# Patient Record
Sex: Female | Born: 1965
Health system: Southern US, Community
[De-identification: ages and names within clinical notes are randomized; demographics above are authoritative.]

## PROBLEM LIST (undated history)

## (undated) DIAGNOSIS — E785 Hyperlipidemia, unspecified: Secondary | ICD-10-CM

## (undated) DIAGNOSIS — I10 Essential (primary) hypertension: Secondary | ICD-10-CM

## (undated) DIAGNOSIS — F419 Anxiety disorder, unspecified: Secondary | ICD-10-CM

## (undated) HISTORY — DX: Hyperlipidemia, unspecified: E78.5

## (undated) HISTORY — DX: Anxiety disorder, unspecified: F41.9

---

## 1999-05-10 ENCOUNTER — Other Ambulatory Visit: Admission: RE | Admit: 1999-05-10 | Discharge: 1999-05-10 | Payer: Self-pay | Admitting: Obstetrics & Gynecology

## 2000-05-15 ENCOUNTER — Other Ambulatory Visit: Admission: RE | Admit: 2000-05-15 | Discharge: 2000-05-15 | Payer: Self-pay | Admitting: Obstetrics & Gynecology

## 2001-03-31 ENCOUNTER — Other Ambulatory Visit: Admission: RE | Admit: 2001-03-31 | Discharge: 2001-03-31 | Payer: Self-pay | Admitting: Obstetrics & Gynecology

## 2002-06-23 ENCOUNTER — Other Ambulatory Visit: Admission: RE | Admit: 2002-06-23 | Discharge: 2002-06-23 | Payer: Self-pay | Admitting: Obstetrics & Gynecology

## 2003-12-24 ENCOUNTER — Other Ambulatory Visit: Admission: RE | Admit: 2003-12-24 | Discharge: 2003-12-24 | Payer: Self-pay | Admitting: Obstetrics & Gynecology

## 2005-04-13 ENCOUNTER — Other Ambulatory Visit: Admission: RE | Admit: 2005-04-13 | Discharge: 2005-04-13 | Payer: Self-pay | Admitting: Obstetrics & Gynecology

## 2007-05-26 ENCOUNTER — Ambulatory Visit (HOSPITAL_COMMUNITY): Admission: RE | Admit: 2007-05-26 | Discharge: 2007-05-26 | Payer: Self-pay | Admitting: Specialist

## 2008-05-12 ENCOUNTER — Inpatient Hospital Stay (HOSPITAL_COMMUNITY): Admission: AD | Admit: 2008-05-12 | Discharge: 2008-05-15 | Payer: Self-pay | Admitting: Obstetrics & Gynecology

## 2008-05-13 ENCOUNTER — Encounter (INDEPENDENT_AMBULATORY_CARE_PROVIDER_SITE_OTHER): Payer: Self-pay | Admitting: Obstetrics & Gynecology

## 2010-02-07 ENCOUNTER — Encounter: Admission: RE | Admit: 2010-02-07 | Discharge: 2010-02-07 | Payer: Self-pay | Admitting: Obstetrics & Gynecology

## 2010-12-24 ENCOUNTER — Encounter: Payer: Self-pay | Admitting: Obstetrics & Gynecology

## 2011-02-06 ENCOUNTER — Other Ambulatory Visit: Payer: Self-pay | Admitting: Obstetrics & Gynecology

## 2011-04-17 NOTE — Op Note (Signed)
Alexandria Clark, Alexandria Clark              ACCOUNT NO.:  [id number]   MEDICAL RECORD NO.:  [id number]          PATIENT TYPE:  INP   LOCATION:  9166                          FACILITY:  [facility]   PHYSICIAN:  Gerrit Friends. Aldona Bar, M.D.   DATE OF BIRTH:  03/15/1966   DATE OF PROCEDURE:  05/13/2008  DATE OF DISCHARGE:                               OPERATIVE REPORT   PREOPERATIVE DIAGNOSES:  Term pregnancy, failure to descend, large for  gestational age meconium-stained amniotic fluid.   POSTOPERATIVE DIAGNOSES:  Term pregnancy, failure to descend, large for  gestational age meconium-stained amniotic fluid, delivery of 8-pound 12  ounces female infant, Apgars 5 and 9.   PROCEDURE:  Primary low transverse cesarean section.   SURGEON:  Gerrit Friends. Aldona Bar, MD   ANESTHESIA:  Epidural.   HISTORY:  This is a 45 year old gravida 2, para 0, was admitted to the  hospital on the evening of May 12, 2008, for induction, but was already  contracting.  She was seen earlier in the office for elevated blood  pressure.  Labs were all normal and the plan was to induce the patient  because of elevated blood pressure.  As mentioned; however, she was  contracting at the time of admission and was observed and did progress  from 1 cm at the time of initial evaluation by me at about 5:00 p.m. to  3 cm at 10:00 p.m. on May 12, 2008, although the vertex remained at -3  station.  Membranes were intact.  She labored, requested and received an  epidural and at about 4:00 a.m. was noted to be fully dilated; however,  the vertex was still at best -3 and membranes were bulging.  I came in  and ruptured membranes and particulate meconium-stained fluid was noted  and with pushing descent really did not come down below -3 station.  Because of an arrest of descent and suspected LGA infant, now with  meconium-stained fluid, a decision was made to proceed to primary low  transverse cesarean section for delivery.   OPERATIVE PROCEDURE:  The  patient was taken to the operating room where  her epidural was augmented.  Her Foley catheter was already in place.  She was prepped and draped in the usual fashion after being positioned  appropriately and once she was draped and good anesthetic levels were  documented, procedure was begun.   A Pfannenstiel incision was made with minimal difficulty, dissected down  sharply to and through the fascia in low transverse fashion with  hemostasis created at each layer.  Subfascial space was created  inferiorly and superiorly, muscles separated in midline, peritoneum  identified with care taken to avoid the bowel superiorly and bladder  inferiorly.  At this time, the vesicouterine peritoneum was incised in  low transverse fashion and pushed off the lower uterine segment with  ease.  Sharp incision in the lower segment was then carried out in a  transverse fashion with Metzenbaum scissors and extended laterally with  the fingers.  Thereafter an attempt was made to deliver, but the vacuum  extractor was required to facilitate delivery.  A viable female infant  was delivered from the vertex position and after the cord was clamped  and cut, the infant was passed off the waiting team where the baby was  rendered free of meconium which was mostly in the nose and mouth.  Infant's Apgar's were noted to be 5 and 9 and subsequently the infant  was taken to nursery in good condition.   The placenta was delivered intact and cord bloods will be obtained by  the cord blood donation team.  Placenta was subsequently sent to  pathology appropriately labeled.   The uterus was then exteriorized and good uterine contractility was  afforded with slowly given intravenous Pitocin and manual stimulation.  The cavity was rendered free of any remaining products of conception.  At this time, the uterine incision was closed using a single layer of #1  Vicryl in a running locking fashion.  Tubes and ovaries were noted  to be  normal, although there were some filmy adhesions involving the fallopian  tubes.  The uterus was replaced in the abdominal cavity after closure of  the uterine incision.  Uterine incision was noted to be dry and after  the abdomen was lavaged of all free blood and clot and no foreign bodies  were noted to be remaining in the abdominal cavity, closure of the  abdomen was carried out in layers.  The abdominal and peritoneum was  closed with 0 Vicryl in a running fashion and muscle secured with the  same.  Subfascial space was inspected and noted to be dry and closure of  the fascia was then carried out using 0 Vicryl from angle to midline  bilaterally.  Subcutaneous tissue was rendered hemostatic and thereafter  staples were used to close the skin.  A sterile pressure dressing was  applied and at this point, the patient was taken to recovery room in  satisfactory condition having tolerated the procedure well.  At the  conclusion of procedure, both mother and baby were doing well in their  respective recovery areas.  Estimated blood loss 500 mL.  All counts  were correct x2.  In summary, this patient presented as an induction at  term because of elevated blood pressure but was an early labor.  No  medications were require to facilitate labor and she requested full  dilatation although the vertex did not descend beyond -3 station.  Meconium-stained fluid was noted with membranes were ruptured after the  patient became fully dilated and because of an arrest disorder of  descent, the patient was taken to the operating for delivery by primary  low transverse cesarean section.      Gerrit Friends. Aldona Bar, M.D.  Electronically Signed     RMW/MEDQ  D:  05/13/2008  T:  05/13/2008  Job:  [id number]

## 2011-04-20 NOTE — Discharge Summary (Signed)
Alexandria Clark, Alexandria Clark              ACCOUNT NO.:  [id number]   MEDICAL RECORD NO.:  [id number]          PATIENT TYPE:  INP   LOCATION:  9135                          FACILITY:  [facility]   PHYSICIAN:  Malva Limes, M.D.    DATE OF BIRTH:  02/02/1966   DATE OF ADMISSION:  05/12/2008  DATE OF DISCHARGE:  05/15/2008                               DISCHARGE SUMMARY   FINAL DIAGNOSES:  Intrauterine pregnancy at term, suspected large for  gestational age infant, failure to descend, and meconium-stained  amniotic fluid.   PROCEDURE:  Primary low-transverse cesarean section.   SURGEON:  Gerrit Friends. Aldona Bar, MD   COMPLICATIONS:  None.   HOSPITAL COURSE:  This 45 year old G2, P 0-0-1-0 presents for an  induction on the evening of June 10.  The patient also had already been  contracting at this time.  She is at term.  She has had some elevated  blood pressures in the office that day.  All her lab work was normal,  but she was still planned for this induction secondary to these elevated  blood pressures at term.  The patient's antepartum course up to this  point had been complicated by advanced maternal age.  She did have an  amniocentesis with a normal female karyotype.  The rest of the patient's  antepartum course had been mostly uncomplicated.  She did pass a 3-hour  sugar test and had a negative group B strep culture obtained in our  office at 35 weeks.  On June 11, when the patient was checked by Dr.  Aldona Bar, the patient was already about 3 cm of dilation at a -3 station.  Upon rupture of membranes, meconium-stained amniotic fluid was noted and  also with pushing there was no descent of the vertex below -3 station.  Because of this arrest of descent and a suspected large for gestational  age infant, a decision was made to proceed with a cesarean section.  The  patient was taken to the operating room on May 13, 2008 by Dr. Annamaria Helling where a primary low-transverse cesarean section was performed  with  the delivery of an 8 pounds 12 ounces female infant with Apgars of 5 and  9.  This delivery went without complications.  The patient's  postoperative course was benign without any significant fevers.  The  patient was felt ready for discharge on postoperative day #2.  She was  sent home on a regular diet, told to decrease activities, told to  continue her prenatal vitamins, was given Percocet one to two every 4-6  hours as needed for pain, and was to follow up in our office in 4 weeks.  Instructions and precautions were reviewed with the patient.   DISCHARGE LABORATORY DATA:  The patient had a hemoglobin of 10.4, white  blood cell count of 9.3, and platelets of 128,000.      Leilani Able, P.A.-C.    ______________________________  Malva Limes, M.D.   MB/MEDQ  D:  05/31/2008  T:  06/01/2008  Job:  [id number]

## 2011-08-30 LAB — CBC
HCT: 37.7
Hemoglobin: 10.4 — ABNORMAL LOW
Hemoglobin: 12.6
MCV: 74.7 — ABNORMAL LOW
MCV: 75.7 — ABNORMAL LOW
Platelets: 161
RBC: 4.07
WBC: 6.7

## 2011-08-30 LAB — COMPREHENSIVE METABOLIC PANEL
ALT: 11
AST: 16
Albumin: 2.9 — ABNORMAL LOW
Alkaline Phosphatase: 146 — ABNORMAL HIGH
BUN: 5 — ABNORMAL LOW
Chloride: 104
GFR calc Af Amer: >60
Glucose, Bld: 103 — ABNORMAL HIGH
Potassium: 3.4 — ABNORMAL LOW
Sodium: 133 — ABNORMAL LOW

## 2011-08-30 LAB — URIC ACID: Uric Acid, Serum: 3.5

## 2011-08-30 LAB — CCBB MATERNAL DONOR DRAW

## 2012-01-07 ENCOUNTER — Encounter (HOSPITAL_COMMUNITY): Payer: Self-pay

## 2012-01-07 ENCOUNTER — Emergency Department (INDEPENDENT_AMBULATORY_CARE_PROVIDER_SITE_OTHER)
Admission: EM | Admit: 2012-01-07 | Discharge: 2012-01-07 | Disposition: A | Discharge status: Home / Self Care | Payer: 59 | Source: Home / Self Care | Attending: Family Medicine | Admitting: Family Medicine

## 2012-01-07 ENCOUNTER — Other Ambulatory Visit: Payer: Self-pay

## 2012-01-07 DIAGNOSIS — R002 Palpitations: Secondary | ICD-10-CM

## 2012-01-07 HISTORY — DX: Essential (primary) hypertension: I10

## 2012-01-07 NOTE — ED Provider Notes (Signed)
History     CSN: [id number]  Arrival date & time 01/07/12  0828   First MD Initiated Contact with Patient 01/07/12 [phone]      Chief Complaint  Patient presents with  . Tachycardia    (Consider location/radiation/quality/duration/timing/severity/associated sxs/prior treatment) HPI Comments: Alexandria Clark presents for evaluation of intermittent episodes of feeling like her heart is racing. She states that she has checked her pulse and found it to be as high as 120s and 140s at times. She reports increased stress recently. She also reports changes in her BP medications, recently starting lisinopril, and wonders if it is related to that. She has been on enalapril and metoprolol in the past. She has never seen a cardiologist.   Patient is a 46 y.o. female presenting with palpitations. The history is provided by the patient.  Palpitations  This is a recurrent problem. The current episode started more than 1 week ago. The problem has not changed since onset.The problem is associated with an unknown factor. Associated symptoms include irregular heartbeat. Pertinent negatives include no diaphoresis, no numbness, no chest pain, no chest pressure, no exertional chest pressure, no near-syncope, no syncope, no dizziness and no weakness. Risk factors include no known risk factors.    Past Medical History  Diagnosis Date  . Hypertension     Past Surgical History  Procedure Date  . Cesarean section     No family history on file.  History  Substance Use Topics  . Smoking status: Never Smoker   . Smokeless tobacco: Not on file  . Alcohol Use: No    OB History    Grav Para Term Preterm Abortions TAB SAB Ect Mult Living                  Review of Systems  Constitutional: Negative.  Negative for diaphoresis.  HENT: Negative.   Eyes: Negative.   Respiratory: Negative.   Cardiovascular: Positive for palpitations. Negative for chest pain, syncope and near-syncope.  Gastrointestinal: Negative.     Genitourinary: Negative.   Musculoskeletal: Negative.   Skin: Negative.   Neurological: Negative.  Negative for dizziness, weakness and numbness.    Allergies  Enalapril and Metoprolol  Home Medications   Current Outpatient Rx  Name Route Sig Dispense Refill  . LISINOPRIL 10 MG PO TABS Oral Take 10 mg by mouth daily.      BP 130/65  Pulse 80  Temp(Src) 98.1 F (36.7 C) (Oral)  Resp 16  SpO2 100%  LMP 12/08/2011  Physical Exam  Nursing note and vitals reviewed. Constitutional: She is oriented to person, place, and time. She appears well-developed and well-nourished.  HENT:  Head: Normocephalic and atraumatic.  Eyes: EOM are normal.  Neck: Normal range of motion.  Cardiovascular: Normal rate and regular rhythm.   No murmur heard. Pulmonary/Chest: Effort normal and breath sounds normal.  Musculoskeletal: Normal range of motion.  Neurological: She is alert and oriented to person, place, and time.  Skin: Skin is warm and dry.  Psychiatric: Her behavior is normal.    ED Course  Procedures (including critical care time)  Labs Reviewed - No data to display No results found.   1. Heart palpitations       MDM  ECG showed NSR, rate 68; rhythm strip for 1 minute showed sinus rhythm but heart rate increased from 80 bpm to 95 bpm over 1 minute; referred back to PCP for cardiology referral and work-up        Richardo Priest, MD  01/07/12 1015 

## 2012-01-07 NOTE — ED Notes (Signed)
C/o episodes of tachycardia since December.  States she thinks her lisinopril is causing it.  States she started on lisinopril in Sept 2012 after undesirable side effects from trials with enalapril and metoprolol.  Reports the episodes last only a few moments and she denies other sx other than feeling anxious- reports sx resolve with relaxation and deep breathing.  Last episode was last night and prior to that a few days before.  Has not contacted her PCP because she wants a different PCP due to all the side effects she is having with the medications they prescribe.  Denies hx of anxiety/panic disorder but states she is under a lot of stress right now.

## 2012-01-08 ENCOUNTER — Emergency Department (HOSPITAL_COMMUNITY): Payer: 59

## 2012-01-08 ENCOUNTER — Emergency Department (HOSPITAL_COMMUNITY)
Admission: EM | Admit: 2012-01-08 | Discharge: 2012-01-08 | Disposition: A | Discharge status: Home / Self Care | Payer: 59 | Attending: Emergency Medicine | Admitting: Emergency Medicine

## 2012-01-08 ENCOUNTER — Other Ambulatory Visit: Payer: Self-pay

## 2012-01-08 ENCOUNTER — Encounter (HOSPITAL_COMMUNITY): Payer: Self-pay | Admitting: *Deleted

## 2012-01-08 DIAGNOSIS — R002 Palpitations: Secondary | ICD-10-CM

## 2012-01-08 DIAGNOSIS — Z79899 Other long term (current) drug therapy: Secondary | ICD-10-CM | POA: Insufficient documentation

## 2012-01-08 DIAGNOSIS — I10 Essential (primary) hypertension: Secondary | ICD-10-CM | POA: Insufficient documentation

## 2012-01-08 LAB — POCT I-STAT, CHEM 8
HCT: 38 % (ref 36.0–46.0)
Potassium: 3.4 mEq/L — ABNORMAL LOW (ref 3.5–5.1)

## 2012-01-08 LAB — CBC
MCH: 22.9 pg — ABNORMAL LOW (ref 26.0–34.0)
MCV: 70.1 fL — ABNORMAL LOW (ref 78.0–100.0)
RDW: 14.5 % (ref 11.5–15.5)

## 2012-01-08 LAB — POCT I-STAT TROPONIN I: Troponin i, poc: 0 ng/mL (ref 0.00–0.08)

## 2012-01-08 MED ORDER — ALPRAZOLAM 0.25 MG PO TABS: 0.2500 mg | ORAL_TABLET | Freq: Three times a day (TID) | ORAL | Status: DC | PRN

## 2012-01-08 NOTE — ED Provider Notes (Signed)
History     CSN: [id number]  Arrival date & time 01/08/12  0502   First MD Initiated Contact with Patient 01/08/12 [phone]      Chief Complaint  Patient presents with  . Tachycardia    (Consider location/radiation/quality/duration/timing/severity/associated sxs/prior treatment) The history is provided by the patient.   patient's been having episodes of tachycardia. She states she's had a few episodes where at night she feels her heart racing. Stopped 120 or 30 by her checking. She seen at urgent care yesterday and was told to follow with her physician. No lightheadedness or dizziness with it. She states she feels anxious. No chest pain. No cough or trouble breathing. No diaphoresis. No weight loss. No heavy periods.   Past Medical History  Diagnosis Date  . Hypertension     Past Surgical History  Procedure Date  . Cesarean section     No family history on file.  History  Substance Use Topics  . Smoking status: Never Smoker   . Smokeless tobacco: Not on file  . Alcohol Use: No    OB History    Grav Para Term Preterm Abortions TAB SAB Ect Mult Living                  Review of Systems  Constitutional: Negative for activity change and appetite change.  HENT: Negative for neck stiffness.   Eyes: Negative for pain.  Respiratory: Negative for chest tightness and shortness of breath.   Cardiovascular: Positive for palpitations. Negative for chest pain and leg swelling.  Gastrointestinal: Negative for nausea, vomiting, abdominal pain and diarrhea.  Genitourinary: Negative for flank pain.  Musculoskeletal: Negative for back pain.  Skin: Negative for rash.  Neurological: Negative for weakness, numbness and headaches.  Psychiatric/Behavioral: Negative for behavioral problems.    Allergies  Enalapril and Metoprolol  Home Medications   Current Outpatient Rx  Name Route Sig Dispense Refill  . LISINOPRIL 10 MG PO TABS Oral Take 10 mg by mouth daily.    Marland Kitchen ALPRAZOLAM 0.25  MG PO TABS Oral Take 1 tablet (0.25 mg total) by mouth 3 (three) times daily as needed for anxiety. 15 tablet 0    BP 136/80  Pulse 83  Temp(Src) 98.5 F (36.9 C) (Oral)  Resp 22  SpO2 100%  LMP 12/23/2011  Physical Exam  Nursing note and vitals reviewed. Constitutional: She is oriented to person, place, and time. She appears well-developed and well-nourished.  HENT:  Head: Normocephalic and atraumatic.  Eyes: Pupils are equal, round, and reactive to light.  Neck: Normal range of motion. Neck supple. No thyromegaly present.  Cardiovascular: Normal rate, regular rhythm and normal heart sounds.   No murmur heard. Pulmonary/Chest: Effort normal and breath sounds normal. No respiratory distress. She has no wheezes. She has no rales.  Abdominal: Soft. Bowel sounds are normal. She exhibits no distension. There is no tenderness. There is no rebound and no guarding.  Musculoskeletal: Normal range of motion.  Neurological: She is alert and oriented to person, place, and time. No cranial nerve deficit.  Skin: Skin is warm and dry.  Psychiatric: She has a normal mood and affect. Her speech is normal.    ED Course  Procedures (including critical care time)  Labs Reviewed  CBC - Abnormal; Notable for the following:    Hemoglobin 11.5 (*)    HCT 35.2 (*)    MCV 70.1 (*)    MCH 22.9 (*)    All other components within normal limits  POCT I-STAT, CHEM 8 - Abnormal; Notable for the following:    Potassium 3.4 (*)    BUN 5 (*)    Glucose, Bld 122 (*)    All other components within normal limits  POCT I-STAT TROPONIN I   Dg Chest 2 View  01/08/2012  *RADIOLOGY REPORT*  Clinical Data: Palpitations, hypertension.  CHEST - 2 VIEW  Comparison: None.  Findings: Lungs are clear. No pleural effusion or pneumothorax. The cardiomediastinal contours are within normal limits. The visualized bones and soft tissues are without significant appreciable abnormality.  IMPRESSION: No acute cardiopulmonary  process.  Original Report Authenticated By: Waneta Martins, M.D.     1. Palpitations      Date: 01/08/2012  Rate: 90  Rhythm: normal sinus rhythm  QRS Axis: normal  Intervals: normal  ST/T Wave abnormalities: normal  Conduction Disutrbances:none  Narrative Interpretation:   Old EKG Reviewed: unchanged    MDM  Palpitations anxiety. EKG is normal here. Lab work is reassuring except for minimal hypokalemia. She is instructed on a diet for that. She will followup with her primary care and cardiology for further evaluation.        Juliet Rude. Rubin Payor, MD 01/08/12 [phone]

## 2012-01-08 NOTE — ED Notes (Signed)
Pt. Discharged to home, pt. Alert and oriented, NAD noted, respirations even and regular 

## 2012-01-08 NOTE — ED Notes (Signed)
Pt has had issues with blood pressure medications in past with dizziness.  Pt sts she has been on Lisinopril since August and was fine until December and had one episode of heart racing.  Then lately had 3 episodes and thought it was related to anxiety.  Pt was seen at [facility] yesterday and told to follow up with primary care.    No chest pain.  Pt sts she feels funny, shakey, jittery.

## 2012-01-10 ENCOUNTER — Ambulatory Visit: Payer: 59 | Admitting: Family Medicine

## 2012-01-10 VITALS — BP 137/87 | HR 75 | Temp 98.0°F | Resp 18 | Ht 68.0 in | Wt 205.0 lb

## 2012-01-10 DIAGNOSIS — N926 Irregular menstruation, unspecified: Secondary | ICD-10-CM

## 2012-01-10 DIAGNOSIS — N915 Oligomenorrhea, unspecified: Secondary | ICD-10-CM

## 2012-01-10 DIAGNOSIS — R002 Palpitations: Secondary | ICD-10-CM

## 2012-01-10 LAB — TSH: TSH: 1.114 u[IU]/mL (ref 0.350–4.500)

## 2012-01-10 LAB — COMPREHENSIVE METABOLIC PANEL
CO2: 27 mEq/L (ref 19–32)
Chloride: 103 mEq/L (ref 96–112)
Creat: 0.8 mg/dL (ref 0.50–1.10)
Glucose, Bld: 96 mg/dL (ref 70–99)
Potassium: 3.9 mEq/L (ref 3.5–5.3)
Sodium: 138 mEq/L (ref 135–145)

## 2012-01-10 LAB — POCT URINALYSIS DIPSTICK
Bilirubin, UA: NEGATIVE
Glucose, UA: NEGATIVE
Ketones, UA: NEGATIVE
Leukocytes, UA: NEGATIVE
Protein, UA: NEGATIVE
pH, UA: 6

## 2012-01-10 MED ORDER — LISINOPRIL 10 MG PO TABS: 10.0000 mg | ORAL_TABLET | Freq: Every day | ORAL | Status: DC

## 2012-01-10 MED ORDER — ALPRAZOLAM 0.25 MG PO TABS: 0.2500 mg | ORAL_TABLET | Freq: Three times a day (TID) | ORAL | Status: DC | PRN

## 2012-01-10 NOTE — Patient Instructions (Signed)
We will call you with your cardiology appt

## 2012-01-10 NOTE — Progress Notes (Signed)
Patient Name: Alexandria Clark Date of Birth: 07-19-1966 Medical Record Number: [id number] Gender: female Date of Encounter: 01/10/2012  History of Present Illness:  Alexandria Clark is a 46 y.o. very pleasant female patient who presents with the following:  History of hypertension. Has noted increasingly frequent periods of tachycardia over the last few months. These episodes had occurred quite sporadically, but have become more frequent over the last few weeks. Occur about once a day, last maybe 15 minutes.  Seem to get better if she "can destress."  Seen at [facility] and at ED over the last week- diagnosed with anxiety? vs other problem.  Suggested referral to cardiology. Also given Rx for xanax which she has tried.  Did help her with sleep.  Took one with an episode of tachycardia and ? If it helped it to go away faster.  Notes that the racing heart occurs first- then notes feeling of anxiety, sweating.   No chest pain or SOB. She is also interested in "taking some time off of work to [employer]".  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  Interested in [employer]. Has had to stop working some days- this has affected 3 days over the last 10 days.  We discussed FMLA and the possibility that a correctable cause will be found- we agreed to take her out of work for one week once I receive her FMLA papers LMP 12/24/11- now has another period as well!  Per pt no chance of pregnancy.  Her menstrual cycles have been shortened over the last several months.    There is no problem list on file for this patient.  Past Medical History  Diagnosis Date  . Hypertension    Past Surgical History  Procedure Date  . Cesarean section    History  Substance Use Topics  . Smoking status: Never Smoker   . Smokeless tobacco: Not on file  . Alcohol Use: No   No family history on file. Allergies  Allergen Reactions  . Enalapril Other (See Comments)    Dizziness  . Metoprolol Other (See  Comments)    Dizziness and hair loss    Medication list has been reviewed and updated. I have reviewed the patient's medical history in detail and updated the computerized patient record. Results for orders placed in visit on 01/10/12  POCT URINALYSIS DIPSTICK      Component Value Range   Color, UA yellow     Clarity, UA clear     Glucose, UA negative     Bilirubin, UA negative     Ketones, UA negative     Spec Grav, UA 1.025     Blood, UA large     pH, UA 6.0     Protein, UA negative     Urobilinogen, UA 0.2     Nitrite, UA negative     Leukocytes, UA Negative    POCT URINE PREGNANCY      Component Value Range   Preg Test, Ur Negative    pt has menses currently. 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  In December her mother had a health scare- dx with MGUS.  This health scare may have worsened her symptoms.  Has a hard time relaxing.  Notes trouble falling asleep due to "a lot on her mind".  Does not really feel sad.  Appetite ok.  Her mother and 3 year  old daughter live with her.   Physical Examination: Filed Vitals:   01/10/12 1144  BP: 137/87  Pulse: 75  Temp: 98 F (36.7 C)  TempSrc: Oral  Resp: 18  Height: 5\' 8"  (1.727 m)  Weight: 205 lb (92.987 kg)    Body mass index is 31.17 kg/(m^2).  GEN: WDWN, NAD, Non-toxic, A & O x 3, overweight HEENT: Atraumatic, Normocephalic. Neck supple. No masses, No LAD.  Thyroid feels normal Ears and Nose: No external deformity. TM, oropharynx wnl bilaterally CV: RRR, No M/G/R. No JVD. No thrill. No extra heart sounds. PULM: CTA B, no wheezes, crackles, rhonchi. No retractions. No resp. distress. No accessory muscle use. ABD: S, NT, ND. EXTR: No c/c/e NEURO Normal gait.  PSYCH: Normally interactive. Conversant. Not depressed or anxious appearing.  Calm demeanor.    Assessment and Plan: 1. Palpitations  TSH, Comprehensive metabolic panel, Ambulatory referral  to Cardiology, ALPRAZolam (XANAX) 0.25 MG tablet, lisinopril (PRINIVIL,ZESTRIL) 10 MG tablet  2. Irregular menses  POCT urinalysis dipstick, POCT urine pregnancy   46 year old female with recent onset of heart palpitations/ racing heart beat which she feels may be due to anxiety or panic attacks.  However, she has not suffered from these problems before and is not depressed.  Need to consider reversible physical cause of her symptoms as well.  Will refer to cardiology and follow-up on labs.  Gave RF of xanax to use PRN and also her lisinopril.  discussed possibly trying another beta blocker in the future, or perhaps an SSRI.  She will seek care if her symptoms worsen or change in the meantime.   Irregular menses could be due to peri-menopause, stress or thyroid problems.

## 2012-01-11 ENCOUNTER — Encounter: Payer: Self-pay | Admitting: Family Medicine

## 2012-01-21 ENCOUNTER — Encounter: Payer: Self-pay | Admitting: *Deleted

## 2012-01-21 DIAGNOSIS — E785 Hyperlipidemia, unspecified: Secondary | ICD-10-CM

## 2012-01-21 DIAGNOSIS — Z0271 Encounter for disability determination: Secondary | ICD-10-CM

## 2012-01-21 DIAGNOSIS — G43909 Migraine, unspecified, not intractable, without status migrainosus: Secondary | ICD-10-CM | POA: Insufficient documentation

## 2012-01-21 DIAGNOSIS — I10 Essential (primary) hypertension: Secondary | ICD-10-CM | POA: Insufficient documentation

## 2012-01-23 ENCOUNTER — Ambulatory Visit (INDEPENDENT_AMBULATORY_CARE_PROVIDER_SITE_OTHER): Payer: 59 | Admitting: Family Medicine

## 2012-01-23 ENCOUNTER — Encounter: Payer: Self-pay | Admitting: Family Medicine

## 2012-01-23 DIAGNOSIS — E782 Mixed hyperlipidemia: Secondary | ICD-10-CM

## 2012-01-23 DIAGNOSIS — F411 Generalized anxiety disorder: Secondary | ICD-10-CM

## 2012-01-23 DIAGNOSIS — I10 Essential (primary) hypertension: Secondary | ICD-10-CM

## 2012-01-23 DIAGNOSIS — E785 Hyperlipidemia, unspecified: Secondary | ICD-10-CM

## 2012-01-23 DIAGNOSIS — F1520 Other stimulant dependence, uncomplicated: Secondary | ICD-10-CM

## 2012-01-23 DIAGNOSIS — F419 Anxiety disorder, unspecified: Secondary | ICD-10-CM

## 2012-01-23 LAB — LIPID PANEL
LDL Cholesterol: 176 mg/dL — ABNORMAL HIGH (ref 0–99)
Total CHOL/HDL Ratio: 4.5 Ratio
VLDL: 13 mg/dL (ref 0–40)

## 2012-01-23 LAB — VITAMIN D 25 HYDROXY (VIT D DEFICIENCY, FRACTURES): Vit D, 25-Hydroxy: 14 ng/mL — ABNORMAL LOW (ref 30–89)

## 2012-01-23 NOTE — Patient Instructions (Signed)
Palpitations  A palpitation is the feeling that your heartbeat is irregular or is faster than normal. Although this is frightening, it usually is not serious. Palpitations may be caused by excesses of smoking, caffeine, or alcohol. They are also brought on by stress and anxiety. Sometimes, they are caused by heart disease. Unless otherwise noted, your caregiver did not find any signs of serious illness at this time. HOME CARE INSTRUCTIONS  To help prevent palpitations:  Drink decaffeinated coffee, tea, and soda pop. Avoid chocolate.   If you smoke or drink alcohol, quit or cut down as much as possible.   Reduce your stress or anxiety level. Biofeedback, yoga, or meditation will help you relax. Physical activity such as swimming, jogging, or walking also may be helpful.  SEEK MEDICAL CARE IF:   You continue to have a fast heartbeat.   Your palpitations occur more often.  SEEK IMMEDIATE MEDICAL CARE IF: You develop chest pain, shortness of breath, severe headache, dizziness, or fainting. Document Released: 11/16/2000 Document Revised: 08/01/2011 Document Reviewed: 01/16/2008 [facility] Patient Information 2012 [place], [place]   .Sources of Caffeine Coffee / Caffeine (mg)  Coffee, brewed, 8 oz / 100 to 200 mg   Coffee, instant, 8 oz / 27 to 173 mg   Espresso, 1 oz / 75 mg  Decaffeinated coffee has 97% of its caffeine removed. Tea / Caffeine (mg)  Tea, brewed, 8 oz / 40 to 120 mg  Caffeine content depends upon the type of tea. Other / Caffeine (mg)  Cola, 12 oz / 35 mg   Chocolate bar, 1.55 oz / 9 mg   Energy drinks, supplements, and pain medicine vary depending on brand. Check the label or with the manufacturer for exact amounts of caffeine.  Document Released: 11/16/2000 Document Revised: 08/01/2011 Document Reviewed: 02/16/2011 [facility] Patient Information 2012 [place], [place].

## 2012-01-24 DIAGNOSIS — F419 Anxiety disorder, unspecified: Secondary | ICD-10-CM | POA: Insufficient documentation

## 2012-01-24 DIAGNOSIS — F1520 Other stimulant dependence, uncomplicated: Secondary | ICD-10-CM | POA: Insufficient documentation

## 2012-01-24 NOTE — Progress Notes (Signed)
  Subjective:    Patient ID: Alexandria Clark, female    DOB: 1966-01-31, 46 y.o.   MRN: [id number]  HPI  This 46 y.o.AA female is here for CPE but has been seen 46 times this month at both [facility]  and [facility] for evaluation of irregular heart and palpitations as well as HTN. She is on medication and is scheduled   for Cardiology assessment . Upon further questioning, she admits that she is having significant anxiety  related to family issues; she has a 46 y.o. And is helping her mother with her health issues. Also, She consumes excessive amounts of caffeine- Mt. Dew is her drink of choice.When she has tried to abstain  from this beverage, she get a headache.       Her menstrual cycle is irregular; last PAP was March 2011 (negative).She has one living child and has had  one abortion.  The pt will schedule GYN exam with Dr. Arlyce Dice ( OB-GYN). Mammogram: April 2012- negative.   Review of Systems  Neurological: Positive for headaches.       Headache occurs 1-2 x monthly and thought to be stress-related  All other systems reviewed and are negative.       Objective:   Physical Exam  Vitals reviewed. Constitutional: She is oriented to person, place, and time. She appears well-developed and well-nourished. No distress.  HENT:  Head: Normocephalic and atraumatic.  Right Ear: External ear normal.  Left Ear: External ear normal.  Nose: Nose normal.  Mouth/Throat: Oropharynx is clear and moist.  Eyes: Conjunctivae and EOM are normal. No scleral icterus.  Neck: Normal range of motion. Neck supple. No thyromegaly present.  Cardiovascular: Normal rate, regular rhythm and normal heart sounds.  Exam reveals no gallop.   No murmur heard. Pulmonary/Chest: Effort normal and breath sounds normal. No respiratory distress.  Abdominal: Soft. Bowel sounds are normal. She exhibits no mass. There is no tenderness.  Musculoskeletal: Normal range of motion. She exhibits no edema and no tenderness.  Neurological:  She is alert and oriented to person, place, and time. She has normal reflexes. No cranial nerve deficit.  Skin: Skin is warm and dry.  Psychiatric: She has a normal mood and affect. Her behavior is normal. Thought content normal.    Labs reviewed from recent ED and [facility] visits      Assessment & Plan:   1. Hyperlipemia  Lipid panel ( significant family history for cardiac-associated medical issues)  2. HTN (hypertension)  Vitamin D, 25-hydroxy  3. Caffeine addiction  Strongly advised to gradually reduce Mt. Dew intake over next 2 weeks to no more than 1 8-oz can per day (would then work on  further reduction)  Try not to use caffeine as "Eye -opener" every day  4. Anxiety disorder  Discussed strategies for coping with stress to include Exercise and better sleep hygiene

## 2012-01-25 MED ORDER — ROSUVASTATIN CALCIUM 20 MG PO TABS: 20.0000 mg | ORAL_TABLET | Freq: Every day | ORAL | Status: DC

## 2012-01-25 NOTE — Progress Notes (Signed)
Labs reviewed and Total chol and LDL still very high even on Crestor 10 mg. I have increased Crestor to 20 mg and routed it to her pharmacy  Please notify her to increase  current Crestor 10 mg to 2 tablets daily until gone  and then pick up the new Rx (20 mg) and take 1 tab daily. There are RFs on new dose.  She will need to RTC in 8 weeks for fasting labs which will be ordered.  She is also Vitamin D deficient and needs to get an OTC supplement-  5000 IU capsules and take 1 capsule daily for 3 months then reduce dose to 2000 IU per capsule daily.

## 2012-01-25 NOTE — Progress Notes (Signed)
Addended by: Dow Adolph B on: 01/25/2012 08:10 PM   Modules accepted: Orders

## 2012-01-25 NOTE — Progress Notes (Signed)
Quick Note:  Please call pt and advise that the following labs are abnormal..Marland KitchenLipids are still high. I have increased Crestor to 20 mg daily but she should finish current supply of Crestor 10 mg by taking 2 daily. Pick up new Rx when finish with 10 mg tabs. Follow-up labs in 8 weeks have been ordered.  Vitamin D is very low; pt needs to get OTC Vit D 5000 IU caps and take one daily for 3 months then decrease dose to Vit D 2000 IU daily.  She should plan to RTC to see me in ~4 months.  Provide her with a copy of labs.  Thank you.  ______

## 2012-01-26 ENCOUNTER — Telehealth: Payer: Self-pay

## 2012-01-26 NOTE — Telephone Encounter (Signed)
Done

## 2012-01-29 ENCOUNTER — Ambulatory Visit (INDEPENDENT_AMBULATORY_CARE_PROVIDER_SITE_OTHER): Payer: 59 | Admitting: Cardiology

## 2012-01-29 ENCOUNTER — Encounter: Payer: Self-pay | Admitting: Cardiology

## 2012-01-29 VITALS — BP 120/88 | HR 78 | Wt 211.6 lb

## 2012-01-29 DIAGNOSIS — R002 Palpitations: Secondary | ICD-10-CM

## 2012-01-29 DIAGNOSIS — I10 Essential (primary) hypertension: Secondary | ICD-10-CM

## 2012-01-29 NOTE — Assessment & Plan Note (Signed)
We need to document her rhythm while she is having palpitations. It is possible that she could have a supraventricular tachycardia. Her cardiac exam and ECG are normal. I think her risk is relatively low. I have okayed her to return to work. We will have her wear an event monitor to try and document her arrhythmia. If her rhythm is normal during these episodes and she may just have anxiety or panic attacks. If she does have tachycardia than the first line of treatment with the elimination of caffeine. I have also encouraged her to get regular aerobic exercise.

## 2012-01-29 NOTE — Progress Notes (Signed)
Alexandria Clark Date of Birth: [birth year] Medical Record #[id number]  History of Present Illness: [name] is seen at the request of Dr. Patsy Lager for evaluation of palpitations. She is a pleasant 46 year old [place] American female who reports she began experiencing palpitations in December. These are described as a rapid heartbeat. Associated with sweating. She has no other associated symptoms. She denies dizziness, syncope, shortness of breath, or chest pain. Typically her symptoms last about 15 minutes and then resolved. Over the past month she has noticed increased frequency of her symptoms to the point where she went to the emergency room on 2 occasions. On both times her symptoms had resolved by the time she presented to the emergency room. Her evaluation was unremarkable. She does drink a lot of caffeine and states she has a [employer]. She does not smoke. She does have a history of hypertension and hyperlipidemia. She had been on metoprolol in the past for blood pressure but at 50 mg per day this led to significant dizziness.  Current Outpatient Prescriptions on File Prior to Visit  Medication Sig Dispense Refill  . ALPRAZolam (XANAX) 0.25 MG tablet Take 1 tablet (0.25 mg total) by mouth 3 (three) times daily as needed for anxiety.  30 tablet  0  . lisinopril (PRINIVIL,ZESTRIL) 10 MG tablet Take 1 tablet (10 mg total) by mouth daily.  30 tablet  3  . rosuvastatin (CRESTOR) 20 MG tablet Take 1 tablet (20 mg total) by mouth daily.  30 tablet  3    Allergies  Allergen Reactions  . Enalapril Other (See Comments)    Dizziness  . Metoprolol Other (See Comments)    Dizziness and hair loss    Past Medical History  Diagnosis Date  . Hypertension   . Other and unspecified hyperlipidemia   . Migraine     ocp induced    Past Surgical History  Procedure Date  . Cesarean section 05/2008    History  Smoking status  . Never Smoker   Smokeless tobacco  . Not on file     History  Alcohol Use No    Family History  Problem Relation Age of Onset  . Hypertension Mother   . Hyperlipidemia Mother   . Cancer Mother 80    Breast  . Hypertension Maternal Aunt   . Hyperlipidemia Maternal Aunt   . Cancer Maternal Aunt 55    Breast; Multiple Myeloma  . Hypertension Maternal Uncle   . Diabetes Maternal Uncle   . Drug abuse Maternal Uncle   . Heart disease Maternal Uncle   . Heart disease Maternal Grandmother     MI @ 59- survived  . Stroke Maternal Grandmother 73  . Diabetes Cousin     Review of Systems: The review of systems is positive for recent diagnosis of vitamin D deficiency. She has a history of mild anemia.  All other systems were reviewed and are negative.  Physical Exam: BP 120/88  Pulse 78  Wt 211 lb 9.6 oz (95.981 kg)  LMP 01/07/2012 She is a pleasant white female in no distress.The patient is alert and oriented x 3.  The mood and affect are normal.  The skin is warm and dry.  Color is normal.  The HEENT exam reveals that the sclera are nonicteric.  The mucous membranes are moist.  The carotids are 2+ without bruits.  There is no thyromegaly.  There is no JVD.  The lungs are clear.  The chest wall is non  tender.  The heart exam reveals a regular rate with a normal S1 and S2.  There are no murmurs, gallops, or rubs.  The PMI is not displaced.   Abdominal exam reveals good bowel sounds.  There is no guarding or rebound.  There is no hepatosplenomegaly or tenderness.  There are no masses.  Exam of the legs reveal no clubbing, cyanosis, or edema.  The legs are without rashes.  The distal pulses are intact.  Cranial nerves II - XII are intact.  Motor and sensory functions are intact.  The gait is normal.  LABORATORY DATA: ECG was reviewed from the emergency department. And was normal. Chemistry panel and TSH were normal. Hemoglobin was 11.4.  Assessment / Plan:

## 2012-01-29 NOTE — Patient Instructions (Signed)
We will have you wear a monitor.  I will see you again in 6 weeks.

## 2012-01-31 ENCOUNTER — Telehealth: Payer: Self-pay

## 2012-01-31 NOTE — Telephone Encounter (Signed)
.  [facility] PT STATES HER PHARMACY REQUESTED A REFILL ON HER ALPRAZOLAM AND IT WAS DENIED BECAUSE WE HAD GIVEN IT TO HER ON THE 7TH. HOWEVER SHE IS TO TAKE 3 TIMES A DAY AND HAVE RUN OUT. PLEASE CALL [phone]   [facility] [address]

## 2012-02-01 ENCOUNTER — Ambulatory Visit (INDEPENDENT_AMBULATORY_CARE_PROVIDER_SITE_OTHER): Payer: 59 | Admitting: Family Medicine

## 2012-02-01 VITALS — BP 126/79 | HR 72 | Temp 97.6°F | Resp 16 | Ht 68.0 in | Wt 203.6 lb

## 2012-02-01 DIAGNOSIS — R002 Palpitations: Secondary | ICD-10-CM

## 2012-02-01 DIAGNOSIS — F411 Generalized anxiety disorder: Secondary | ICD-10-CM

## 2012-02-01 DIAGNOSIS — F419 Anxiety disorder, unspecified: Secondary | ICD-10-CM

## 2012-02-01 MED ORDER — ALPRAZOLAM 0.25 MG PO TABS: 0.2500 mg | ORAL_TABLET | Freq: Three times a day (TID) | ORAL | Status: AC | PRN

## 2012-02-01 MED ORDER — PAROXETINE HCL 20 MG PO TABS: 20.0000 mg | ORAL_TABLET | ORAL | Status: DC

## 2012-02-01 NOTE — Telephone Encounter (Signed)
Dr Patsy Lager, do you want to RF? It does look like you wrote Rx for TID (prn) on 01/10/12

## 2012-02-01 NOTE — Progress Notes (Signed)
Patient Name: Alexandria Clark Date of Birth: [birth year] Medical Record Number: [id number] Gender: female Date of Encounter: 02/01/2012  History of Present Illness:  Alexandria Clark is a 46 y.o. very pleasant female patient who presents with the following:  She is here today to recheck her heart palpations.  She is currently wearing an event monitor per Dr. Swaziland for one month.  Xanax did seem to help with her palpitations.  She also started on Vitamin D about 10 days ago- since then she has noted worsening of anxiety.  She has been using her xanax which does help with anxiety as well.  She still does not feel that she can return to work.  Chela had before felt that she just had heart palpations/ other physical symptoms but not anxiety- she now feels that she does indeed have anxiety and that she would like to start some medication to help with this,  Patient Active Problem List  Diagnoses  . Hypertension  . Other and unspecified hyperlipidemia  . Migraine  . Caffeine addiction  . Anxiety  . Palpitations   Past Medical History  Diagnosis Date  . Hypertension   . Other and unspecified hyperlipidemia   . Migraine     ocp induced   Past Surgical History  Procedure Date  . Cesarean section 05/2008   History  Substance Use Topics  . Smoking status: Never Smoker   . Smokeless tobacco: Not on file  . Alcohol Use: No   Family History  Problem Relation Age of Onset  . Hypertension Mother   . Hyperlipidemia Mother   . Cancer Mother 60    Breast  . Hypertension Maternal Aunt   . Hyperlipidemia Maternal Aunt   . Cancer Maternal Aunt 55    Breast; Multiple Myeloma  . Hypertension Maternal Uncle   . Diabetes Maternal Uncle   . Drug abuse Maternal Uncle   . Heart disease Maternal Uncle   . Heart disease Maternal Grandmother     MI @ 77- survived  . Stroke Maternal Grandmother 65  . Diabetes Cousin    Allergies  Allergen Reactions  . Enalapril Other (See Comments)   Dizziness  . Metoprolol Other (See Comments)    Dizziness and hair loss    Medication list has been reviewed and updated.  Review of Systems: As per HPI- otherwise negative. Continues to notice some heart palpations occasionally and is using her event monitor  Physical Examination: Filed Vitals:   02/01/12 1339  BP: 126/79  Pulse: 72  Temp: 97.6 F (36.4 C)  TempSrc: Oral  Resp: 16  Height: 5\' 8"  (1.727 m)  Weight: 203 lb 9.6 oz (92.352 kg)    Body mass index is 30.96 kg/(m^2).  GEN: WDWN, NAD, Non-toxic, A & O x 3, overweight HEENT: Atraumatic, Normocephalic. Neck supple. No masses, No LAD. Ears and Nose: No external deformity. CV: RRR, No M/G/R. No JVD. No thrill. No extra heart sounds. PULM: CTA B, no wheezes, crackles, rhonchi. No retractions. No resp. distress. No accessory muscle use. ABD: S, NT, ND, +BS. No rebound. No HSM. EXTR: No c/c/e NEURO Normal gait.  PSYCH: Normally interactive. Conversant. Not depressed or anxious appearing.  Calm demeanor.    Assessment and Plan: 1. Anxiety  PARoxetine (PAXIL) 20 MG tablet  2. Palpitations  ALPRAZolam (XANAX) 0.25 MG tablet   Anxiety which seems to be causing heart palpitations.  Will start paxil at 20 mg- she will call with an update in a couple of  weeks as we may increase the dose to 40.  Continue xanax as needed, but encouraged her to try and be sparing with this medication.  Also, explained that I do not think it is in her best interests to be out of work for a prolonged period.  We will plan for her to try and return to work not this Monday but the following Monday, 3/ 11/13.  However, if this does not seem possible to her she can call us and make a new plan.  Otherwise please call with update as above- Sooner if worse.

## 2012-02-01 NOTE — Telephone Encounter (Signed)
Seen today at clinic

## 2012-02-07 ENCOUNTER — Telehealth: Payer: Self-pay

## 2012-02-07 ENCOUNTER — Encounter: Payer: Self-pay | Admitting: Cardiology

## 2012-02-07 NOTE — Telephone Encounter (Deleted)
Error, no message taken.

## 2012-02-07 NOTE — Telephone Encounter (Signed)
.  [facility]    DR Gordy Levan FROM [facility] TO SPEAK WITH DR Patsy Lager RE PT,( LANGUAGE BARRIER,BUT i THINK THE DR'S NAME IS GERO-KON     BEST PHONE [phone]

## 2012-02-08 NOTE — Telephone Encounter (Signed)
Called back- there was no answer and phone went to a voice message system typical of a cell phone- did not leave message.

## 2012-02-08 NOTE — Telephone Encounter (Signed)
Called Dr Levell July office and explained that Dr Patsy Lager will not be in office until Monday, and asked if a PA needed to call and speak with him sooner. Since PA will not be as familiar with pt, was told that they will wait for Dr Copland's call on Monday. It was difficult to understand the person answering phone.

## 2012-02-11 ENCOUNTER — Telehealth: Payer: Self-pay

## 2012-02-11 NOTE — Telephone Encounter (Signed)
.  [facility] PT STATES DR COPLAND WANTED HER TO CALL BACK AND SEE IF IT WAS OK FOR HER TO SPEAK WITH [place] ABOUT HER AND ALSO WOULD LIKE TO KNOW IF YOU ARE ABLE TO SPEAK WITH THEM BECAUSE THEY NEED MORE INFO. PLEASE CALL [phone]

## 2012-02-11 NOTE — Telephone Encounter (Signed)
Please let her know I did talk to the doctor today

## 2012-02-11 NOTE — Telephone Encounter (Signed)
Spoke with doctor today

## 2012-02-13 NOTE — Telephone Encounter (Signed)
Notified pt Dr Patsy Lager has talked with the other MD. Pt thanks her and would like Dr Patsy Lager to write a note on a script or letterhead clearing her to RTW on Monday 3/18 and fax to [phone] ([employer] claim [phone] AA. Pt reports she is feeling better, is still having some panic episodes but is able to cope through them. She also wants to know if she should continue Paxil at same dose, she states she has been on it for about 2 wks, and I D/W her that it is probably not even working at full effectiveness yet.

## 2012-02-15 NOTE — Telephone Encounter (Signed)
PT STATES THAT SHE WOULD LIKE TO RETURN TO WORK ON MONDAY AND SHE WILL NEED A NOTE FROM [place] IN ORDER TO RETURN.

## 2012-02-16 NOTE — Telephone Encounter (Signed)
To Dr Copland.   

## 2012-02-16 NOTE — Telephone Encounter (Signed)
Dr. Patsy Lager Please address Thanks Helmut Muster

## 2012-02-17 ENCOUNTER — Encounter: Payer: Self-pay | Admitting: Family Medicine

## 2012-02-17 NOTE — Telephone Encounter (Signed)
Letter done for patient - see letter.  Alexandria Clark called to check on her- asked her to call with an update in the next couple of weeks

## 2012-02-17 NOTE — Telephone Encounter (Signed)
Duplicate message. 

## 2012-02-18 ENCOUNTER — Telehealth: Payer: Self-pay | Admitting: Cardiology

## 2012-02-18 NOTE — Telephone Encounter (Signed)
Would like to talk to you about the above mentioned pt

## 2012-02-19 NOTE — Telephone Encounter (Signed)
Patient called, stating Dr.Kon is a Armed forces technical officer Dr.States he was calling to get information.States Dr.Copeland took her out of work,not Dr.Jordan.States she is back at work now.Patient was told Dr.Kon never returned phone call this morning.

## 2012-02-19 NOTE — Telephone Encounter (Signed)
Fu call °Patient returning your call °

## 2012-02-19 NOTE — Telephone Encounter (Signed)
Dr.Jerome Kon called, no answer.LMTC.

## 2012-03-12 ENCOUNTER — Ambulatory Visit: Payer: 59 | Admitting: Cardiology

## 2012-03-14 ENCOUNTER — Encounter: Payer: Self-pay | Admitting: Cardiology

## 2012-03-27 ENCOUNTER — Telehealth: Payer: Self-pay

## 2012-03-27 NOTE — Telephone Encounter (Signed)
Could you please call Dr. Rachelle Hora about this patient.

## 2012-03-27 NOTE — Telephone Encounter (Signed)
.  [facility] Dr. Charlies Constable would like to discuss Alexandria Clark's disability with Dr. Patsy Lager and Dr. Audria Nine.  Dr. Rachelle Hora may be reached at [phone].

## 2012-03-31 ENCOUNTER — Telehealth: Payer: Self-pay

## 2012-03-31 ENCOUNTER — Telehealth: Payer: Self-pay | Admitting: Cardiology

## 2012-03-31 NOTE — Telephone Encounter (Signed)
New problem;  Per Cala Bradford, calling from Dr. Carlean Jews office in [place] medical review - insurance company.  peer to peer review.

## 2012-03-31 NOTE — Telephone Encounter (Signed)
Unable to return Selena Batten from Dr.Yamour's office back [phone] is wrong number.

## 2012-03-31 NOTE — Telephone Encounter (Signed)
messsage is for dr copland,mcpherson,   Dr Rachelle Hora wants call back in regard to pt's application for disability.   Best phone [phone]

## 2012-04-02 ENCOUNTER — Telehealth: Payer: Self-pay | Admitting: Cardiology

## 2012-04-02 ENCOUNTER — Telehealth: Payer: Self-pay

## 2012-04-02 NOTE — Telephone Encounter (Signed)
RECEIVED CALL FROM KIM W/ DR BEVERLY YAMOUR'S OFFICE THEY NEEDS TO SPEAK WITH DR [facility] TODAY IN REGARDS TO A PEER TO PEER REVIEW  THIS MUST BE COMPLETED TODAY PER KIM    KIM PH# [phone]

## 2012-04-02 NOTE — Telephone Encounter (Signed)
I spoke with Dr. Rachelle Hora who was gathering information pertaining to pt's application for disabilitity. He was primarily interested in her diagnosis of "Anxiety" and what clarification I could provide. I commented that she had an anxiety disorder but it was mild in severity as she was on no prescription medication for it. He was satisfied with that and that was the extent of the conversation.

## 2012-04-02 NOTE — Telephone Encounter (Signed)
Returned Kim's call at [phone] no answer.Unable to leave message, phone not set up to leave messages.

## 2012-04-02 NOTE — Telephone Encounter (Signed)
I called Dr. Charlies Constable at 10:20 AM and left a message re: need to discuss pt's disability. The message included my hours of availability today and tomorrow.

## 2012-04-02 NOTE — Telephone Encounter (Signed)
Corey Harold MD WOULD LIKE TO SPEAK WITH DR Patsy Lager REGARDING PT PLEASE CALL [phone]

## 2012-04-02 NOTE — Telephone Encounter (Signed)
New problem:  Per Selena Batten calling from  Central office need to do a peer to peer review with Dr. [place] -  Due back to insurance today.

## 2012-04-02 NOTE — Telephone Encounter (Signed)
Please call number and see if P2P is done

## 2012-04-03 NOTE — Telephone Encounter (Signed)
Kim called no answer,Voice mail box not set up yet unable to leave message.

## 2012-04-04 NOTE — Telephone Encounter (Signed)
Patient called no answer.LMTC. 

## 2012-04-09 NOTE — Telephone Encounter (Signed)
Patient called no answer.LMTC. 

## 2012-04-17 NOTE — Telephone Encounter (Signed)
Patient called no answer.LMTC. 

## 2012-05-14 ENCOUNTER — Encounter: Payer: Self-pay | Admitting: Cardiology

## 2012-05-14 ENCOUNTER — Ambulatory Visit (INDEPENDENT_AMBULATORY_CARE_PROVIDER_SITE_OTHER): Payer: 59 | Admitting: Cardiology

## 2012-05-14 VITALS — BP 130/78 | HR 76 | Ht 68.0 in | Wt 207.0 lb

## 2012-05-14 DIAGNOSIS — R002 Palpitations: Secondary | ICD-10-CM

## 2012-05-14 NOTE — Assessment & Plan Note (Signed)
Palpitations have resolved. Previous event monitor was unremarkable. She is a normal cardiac exam and ECG. No further workup or treatment indicated. I will see her back as needed.

## 2012-05-14 NOTE — Patient Instructions (Addendum)
We will see you as needed. 

## 2012-05-14 NOTE — Progress Notes (Signed)
   Alexandria Clark Date of Birth: 1966-06-11 Medical Record #[id number]  History of Present Illness: [name] is seen for followup of her palpitations. She reports that she is doing very well. She is having no further palpitations. She thinks that correcting her vitamin D deficiency is what helped them go away. She did wear an event monitor previously which showed sinus rhythm. Her anxiety has also been less with vitamin D repletion.  Current Outpatient Prescriptions on File Prior to Visit  Medication Sig Dispense Refill  . Cholecalciferol 4000 UNITS CAPS Take 1 capsule by mouth daily.      Marland Kitchen lisinopril (PRINIVIL,ZESTRIL) 10 MG tablet Take 1 tablet (10 mg total) by mouth daily.  30 tablet  3  . rosuvastatin (CRESTOR) 20 MG tablet Take 1 tablet (20 mg total) by mouth daily.  30 tablet  3    Allergies  Allergen Reactions  . Enalapril Other (See Comments)    Dizziness  . Metoprolol Other (See Comments)    Dizziness and hair loss    Past Medical History  Diagnosis Date  . Hypertension   . Other and unspecified hyperlipidemia   . Migraine     ocp induced    Past Surgical History  Procedure Date  . Cesarean section 05/2008    History  Smoking status  . Never Smoker   Smokeless tobacco  . Not on file    History  Alcohol Use No    Family History  Problem Relation Age of Onset  . Hypertension Mother   . Hyperlipidemia Mother   . Cancer Mother 10    Breast  . Hypertension Maternal Aunt   . Hyperlipidemia Maternal Aunt   . Cancer Maternal Aunt 55    Breast; Multiple Myeloma  . Hypertension Maternal Uncle   . Diabetes Maternal Uncle   . Drug abuse Maternal Uncle   . Heart disease Maternal Uncle   . Heart disease Maternal Grandmother     MI @ 44- survived  . Stroke Maternal Grandmother 10  . Diabetes Cousin     Review of Systems: Negative  Physical Exam: BP 130/78  Pulse 76  Ht 5\' 8"  (1.727 m)  Wt 207 lb (93.895 kg)  BMI 31.47 kg/m2   LABORATORY  DATA:   Assessment / Plan:

## 2012-05-19 ENCOUNTER — Other Ambulatory Visit: Payer: Self-pay | Admitting: Family Medicine

## 2012-06-18 ENCOUNTER — Ambulatory Visit (INDEPENDENT_AMBULATORY_CARE_PROVIDER_SITE_OTHER): Payer: 59 | Admitting: Family Medicine

## 2012-06-18 ENCOUNTER — Encounter: Payer: Self-pay | Admitting: Family Medicine

## 2012-06-18 VITALS — BP 134/96 | HR 82 | Temp 98.4°F | Resp 16 | Ht 67.5 in | Wt 200.4 lb

## 2012-06-18 DIAGNOSIS — E7800 Pure hypercholesterolemia, unspecified: Secondary | ICD-10-CM

## 2012-06-18 DIAGNOSIS — E559 Vitamin D deficiency, unspecified: Secondary | ICD-10-CM

## 2012-06-18 MED ORDER — LISINOPRIL 10 MG PO TABS: 10.0000 mg | ORAL_TABLET | Freq: Every day | ORAL | Status: DC

## 2012-06-18 NOTE — Progress Notes (Signed)
S:  This pt has HTN and has recently been evaluated by Cardiology (no significant findings). She states that since she has been taking Vitamin D3 supplement, she is feeling better. She reports that several family members have Vit D deficiency           problems.     She wants Vit D level and lipids rechecked. Has been modifying nutrition and activity level.  O:  Vital signs reviewed   In NAD  WN,WD        HENT: [place]/AT; EOMI; otherwise, benign        COR: RRR        LUNGS: Normal resp rate and effort        NEURO: Nonfocal.    1. Hypercholesteremia - on Crestor (generic) Lipid panel  2. Vitamin d deficiency - taking OTC supplement Vitamin D, 25-hydroxy

## 2012-06-19 LAB — LIPID PANEL
LDL Cholesterol: 186 mg/dL — ABNORMAL HIGH (ref 0–99)
Total CHOL/HDL Ratio: 4.4 Ratio
VLDL: 21 mg/dL (ref 0–40)

## 2012-06-20 NOTE — Progress Notes (Signed)
Quick Note:  Please call pt and advise that the following labs are abnormal... Lipid values have increased since last measured 4 months ago. Continue taking Rosuvastatin (Crestor) 20 mg daily and try not to miss any doses. Also change your nutrition; try the "Mediterranean Diet"- you can find information about this online. Regular exercise helps to reduce cholesterol also.  If you are not taking Fish Oil, get OTC Omega 3 & 6 Fish Oil 1200 mg per capsule and take 1 capsule daily, separate from your solid tablets.  These labs will be rechecked at your next visit in 3 months. You will need to have fasting levels drawn.  Copy to pt. ______

## 2012-07-15 ENCOUNTER — Telehealth: Payer: Self-pay

## 2012-07-15 NOTE — Telephone Encounter (Signed)
Please advise, and I can call patient about the crestor.

## 2012-07-15 NOTE — Telephone Encounter (Signed)
Dr. Audria Nine please advise how you would like to proceed.

## 2012-07-15 NOTE — Telephone Encounter (Signed)
Pt states when Dr Audria Nine increased her crestor to 20 mg,she started having numerous symptoms which include muscle cramps,numbness in face,tingling,so she went off meds completely,please advise.    Best phone [phone]   Pharmacy [facility] [address]

## 2012-07-16 NOTE — Telephone Encounter (Signed)
Advise pt to continue OTC Fish Oil Supplement as recommended and an remain off Crestor. Will recheck lipids at visit in October.

## 2012-07-16 NOTE — Telephone Encounter (Signed)
Spoke with pt, I gave her info from Dr Audria Nine. Pt understood and will see her in October

## 2012-10-06 ENCOUNTER — Other Ambulatory Visit: Payer: Self-pay | Admitting: Family Medicine

## 2012-11-11 ENCOUNTER — Other Ambulatory Visit: Payer: Self-pay | Admitting: Physician Assistant

## 2012-11-11 NOTE — Telephone Encounter (Signed)
Needs OV/labs - 2nd notice, pt was due for f/u in Oct 2013

## 2012-11-14 ENCOUNTER — Ambulatory Visit: Payer: 59 | Admitting: Family Medicine

## 2012-11-20 ENCOUNTER — Ambulatory Visit: Payer: 59 | Admitting: Family Medicine

## 2012-12-10 ENCOUNTER — Ambulatory Visit (INDEPENDENT_AMBULATORY_CARE_PROVIDER_SITE_OTHER): Payer: 59 | Admitting: Family Medicine

## 2012-12-10 ENCOUNTER — Encounter: Payer: Self-pay | Admitting: Family Medicine

## 2012-12-10 VITALS — BP 132/90 | HR 96 | Temp 97.7°F | Resp 16 | Ht 68.0 in | Wt 203.0 lb

## 2012-12-10 DIAGNOSIS — Z23 Encounter for immunization: Secondary | ICD-10-CM

## 2012-12-10 DIAGNOSIS — I10 Essential (primary) hypertension: Secondary | ICD-10-CM

## 2012-12-10 DIAGNOSIS — E7800 Pure hypercholesterolemia, unspecified: Secondary | ICD-10-CM

## 2012-12-10 DIAGNOSIS — T50995A Adverse effect of other drugs, medicaments and biological substances, initial encounter: Secondary | ICD-10-CM

## 2012-12-10 LAB — LIPID PANEL
HDL: 56 mg/dL (ref >39)
LDL Cholesterol: 176 mg/dL — ABNORMAL HIGH (ref 0–99)
VLDL: 15 mg/dL (ref 0–40)

## 2012-12-10 LAB — COMPREHENSIVE METABOLIC PANEL
AST: 11 U/L (ref 0–37)
Albumin: 4.2 g/dL (ref 3.5–5.2)
Alkaline Phosphatase: 52 U/L (ref 39–117)
Potassium: 4.4 mEq/L (ref 3.5–5.3)
Sodium: 138 mEq/L (ref 135–145)
Total Bilirubin: 0.3 mg/dL (ref 0.3–1.2)
Total Protein: 6.7 g/dL (ref 6.0–8.3)

## 2012-12-10 MED ORDER — LISINOPRIL 10 MG PO TABS: ORAL_TABLET | ORAL | Status: DC

## 2012-12-10 NOTE — Patient Instructions (Addendum)
[facility] has a "Lipid Diet"- go to their web site and look for a link to "Nutrition" or "Lipid Disorder Center"

## 2012-12-10 NOTE — Progress Notes (Signed)
S: This 47 y.o. AA female has HTN and Hypercholesterolemia (mother has elevated cholesterol also and takes Crestor). Pt was taking Crestor but has myalgias and swollen neck several weeks ago and was advised to discontinue the statin. Adverse symptoms resolved and she is now taking Flax Seed Oil capsule. She has no problem w/ Lisinopril which she has been taking for years; she denies fatigue, CP or tightness, palpitations, SOB or DOE, edema, HA, dizziness, weakness, numbness or syncope. She plans to start exercising on stationary bike.  ROS: As per HPI.  O:  Filed Vitals:   12/10/12 0839  BP: 132/90  Pulse: 96  Temp: 97.7 F (36.5 C)  Resp: 16   GEN: In NAD; WN,WD. HENT: [place]/AT; EOMI w/ clear conj/ sclerae. Otherwise unremarkable. COR: RRR. LUNGS: Normal resp rate and effort. MS: FROM; no c/c/e. NEURO: A&O x 3; CNs intact. Nonfocal.  A/P: 1. HTN (hypertension)  Continue Lisinopril.  2. Unspecified adverse effect of other drug, medicinal and biological substance(995.29)  Stay off statins.  3. Hypercholesteremia  Continue Flax Seed Oil capsule 1200 mg daily Look up [facility] "Lipid Diet" Regular exercise  4. Need for influenza vaccination  Flu vaccine greater than or equal to 3yo preservative free IM, Comprehensive metabolic panel, Lipid panel

## 2012-12-13 ENCOUNTER — Encounter: Payer: Self-pay | Admitting: Family Medicine

## 2012-12-13 NOTE — Progress Notes (Signed)
Quick Note:  Please call pt and advise that the following labs are abnormal... Message sent to pt via MyChart... ______

## 2013-10-07 ENCOUNTER — Ambulatory Visit (INDEPENDENT_AMBULATORY_CARE_PROVIDER_SITE_OTHER): Payer: 59 | Admitting: *Deleted

## 2013-10-07 DIAGNOSIS — Z23 Encounter for immunization: Secondary | ICD-10-CM

## 2013-11-20 ENCOUNTER — Other Ambulatory Visit: Payer: Self-pay | Admitting: Family Medicine

## 2013-12-22 ENCOUNTER — Ambulatory Visit (INDEPENDENT_AMBULATORY_CARE_PROVIDER_SITE_OTHER): Payer: 59 | Admitting: Family Medicine

## 2013-12-22 ENCOUNTER — Encounter: Payer: Self-pay | Admitting: Family Medicine

## 2013-12-22 VITALS — BP 135/87 | HR 91 | Temp 98.3°F | Resp 16 | Ht 68.0 in | Wt 212.0 lb

## 2013-12-22 DIAGNOSIS — E785 Hyperlipidemia, unspecified: Secondary | ICD-10-CM

## 2013-12-22 DIAGNOSIS — I10 Essential (primary) hypertension: Secondary | ICD-10-CM

## 2013-12-22 DIAGNOSIS — Z0000 Encounter for general adult medical examination without abnormal findings: Secondary | ICD-10-CM

## 2013-12-22 MED ORDER — LISINOPRIL 10 MG PO TABS: 10.0000 mg | ORAL_TABLET | Freq: Every day | ORAL | Status: DC

## 2013-12-22 NOTE — Progress Notes (Signed)
Subjective:    Patient ID: Alexandria Clark, female    DOB: 1966/11/17, 48 y.o.   MRN: [id number]  HPI  This 48 y.o. AA female has HTN and is here for CPE. Pelvic w/ PAP is scheduled w/ GYN practice; MMG is scheduled through that office. Pt  just recovering from flu; she was ill for > 2 weeks.   HCM: PAP- March 2012 (nromal)           MMG- March 2012 (normal).           IMM- Current.  Patient Active Problem List   Diagnosis Date Noted  . Palpitations 01/29/2012  . 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 Anxiety 01/24/2012  . Hypertension   . Other and unspecified hyperlipidemia   . Migraine    PMHx, Surg Hx, Soc and Fam Hx reviewed. Medications reconciled.   Review of Systems  Constitutional: Negative.   Eyes: Negative.   Respiratory: Positive for cough. Negative for chest tightness and shortness of breath.   Cardiovascular: Negative.   Gastrointestinal: Negative.   Endocrine: Negative.   Genitourinary: Negative.   Musculoskeletal: Negative.   Skin: Negative.   Allergic/Immunologic: Negative.   Neurological: Positive for headaches.       Menstrual HA; pt has discussed this w/ GYN. Relieved w/ OTC NSAIDs.  Hematological: Negative.   Psychiatric/Behavioral: Negative.        Objective:   Physical Exam  Nursing note and vitals reviewed. Constitutional: She is oriented to person, place, and time. Vital signs are normal. She appears well-developed and well-nourished. No distress.  HENT:  Head: Normocephalic and atraumatic.  Right Ear: Hearing, tympanic membrane, external ear and ear canal normal.  Left Ear: Hearing, tympanic membrane, external ear and ear canal normal.  Nose: Nose normal. No mucosal edema, rhinorrhea, nasal deformity or septal deviation.  Mouth/Throat: Uvula is midline and mucous membranes are normal. No oral lesions. Normal dentition. No dental caries. Posterior oropharyngeal erythema present. No oropharyngeal exudate.  Eyes: Conjunctivae, EOM and lids are  normal. Pupils are equal, round, and reactive to light. No scleral icterus.  Neck: Normal range of motion and full passive range of motion without pain. Neck supple. No JVD present. No spinous process tenderness and no muscular tenderness present. No mass and no thyromegaly present.  Cardiovascular: Normal rate, regular rhythm, S1 normal, S2 normal, normal heart sounds and normal pulses.   No extrasystoles are present. PMI is not displaced.  Exam reveals no gallop and no friction rub.   No murmur heard. Pulmonary/Chest: Effort normal. No apnea. No respiratory distress. She has decreased breath sounds in the right lower field and the left lower field. She has no wheezes. She has no rhonchi.  Decreased BS due to poor inspiratory effort.  Abdominal: Soft. Normal appearance and bowel sounds are normal. She exhibits no distension and no mass. There is no hepatosplenomegaly. There is no tenderness. There is no guarding and no CVA tenderness.  Genitourinary:  Deferred.  Musculoskeletal: Normal range of motion. She exhibits no edema and no tenderness.  Lymphadenopathy:       Head (right side): No submental, no submandibular, no tonsillar, no posterior auricular and no occipital adenopathy present.       Head (left side): No submental, no submandibular, no tonsillar, no posterior auricular and no occipital adenopathy present.    She has no cervical adenopathy.       Right: No supraclavicular adenopathy present.       Left: No supraclavicular adenopathy present.  Neurological: She is alert and oriented to person, place, and time. She has normal strength. She displays no atrophy and no tremor. No cranial nerve deficit or sensory deficit. She exhibits normal muscle tone. She displays a negative Romberg sign. Coordination and gait normal.  Reflex Scores:      Tricep reflexes are 1+ on the right side and 1+ on the left side.      Bicep reflexes are 1+ on the right side and 1+ on the left side.      Patellar  reflexes are 2+ on the right side and 2+ on the left side. Skin: Skin is warm, dry and intact. No ecchymosis, no lesion and no rash noted. She is not diaphoretic. No cyanosis or erythema. Nails show no clubbing.  Psychiatric: She has a normal mood and affect. Her speech is normal and behavior is normal. Judgment and thought content normal. Cognition and memory are normal.       Assessment & Plan:  Routine general medical examination at a health care facility - Plan: Lipid panel, COMPLETE METABOLIC PANEL WITH GFR  Hypertension- Continue current medication- Lisinopril 10 mg 1 tablet daily.  Other and unspecified hyperlipidemia- Pt intolerant of statins and Fish Oil capsules; she has been able to tolerate Flax Seed Oil capsules.   Meds ordered this encounter  Medications  . cholecalciferol (VITAMIN D) 1000 UNITS tablet    Sig: Take 1,000 Units by mouth daily.  . Cholecalciferol 4000 UNITS CAPS    Sig: Take 1 capsule by mouth daily.  Marland Kitchen. lisinopril (PRINIVIL,ZESTRIL) 10 MG tablet    Sig: Take 1 tablet (10 mg total) by mouth daily.    Dispense:  30 tablet    Refill:  11

## 2013-12-22 NOTE — Patient Instructions (Signed)

## 2013-12-23 LAB — COMPLETE METABOLIC PANEL WITH GFR
ALT: 8 U/L (ref 0–35)
AST: 14 U/L (ref 0–37)
Albumin: 3.8 g/dL (ref 3.5–5.2)
Alkaline Phosphatase: 61 U/L (ref 39–117)
BILIRUBIN TOTAL: 0.6 mg/dL (ref 0.3–1.2)
BUN: 10 mg/dL (ref 6–23)
CO2: 29 mEq/L (ref 19–32)
Calcium: 9 mg/dL (ref 8.4–10.5)
Chloride: 103 mEq/L (ref 96–112)
Creat: 0.81 mg/dL (ref 0.50–1.10)
GFR, EST NON AFRICAN AMERICAN: 87 mL/min
GLUCOSE: 80 mg/dL (ref 70–99)
Potassium: 4.2 mEq/L (ref 3.5–5.3)
Sodium: 137 mEq/L (ref 135–145)
Total Protein: 6.4 g/dL (ref 6.0–8.3)

## 2013-12-23 LAB — LIPID PANEL
CHOL/HDL RATIO: 3.5 ratio
CHOLESTEROL: 216 mg/dL — AB (ref 0–200)
HDL: 61 mg/dL (ref >39)
LDL Cholesterol: 143 mg/dL — ABNORMAL HIGH (ref 0–99)
TRIGLYCERIDES: 58 mg/dL (ref <150)
VLDL: 12 mg/dL (ref 0–40)

## 2013-12-24 ENCOUNTER — Encounter: Payer: Self-pay | Admitting: Family Medicine

## 2014-06-17 NOTE — Telephone Encounter (Signed)
Close encounter 

## 2014-06-22 ENCOUNTER — Ambulatory Visit: Payer: Self-pay | Admitting: Family Medicine

## 2014-11-29 ENCOUNTER — Other Ambulatory Visit: Payer: Self-pay | Admitting: Family Medicine

## 2014-12-03 ENCOUNTER — Other Ambulatory Visit: Payer: Self-pay | Admitting: Family Medicine

## 2015-01-21 ENCOUNTER — Ambulatory Visit (INDEPENDENT_AMBULATORY_CARE_PROVIDER_SITE_OTHER): Payer: 59

## 2015-01-21 ENCOUNTER — Encounter: Payer: Self-pay | Admitting: Physician Assistant

## 2015-01-21 ENCOUNTER — Ambulatory Visit (INDEPENDENT_AMBULATORY_CARE_PROVIDER_SITE_OTHER): Payer: 59 | Admitting: Family Medicine

## 2015-01-21 VITALS — BP 149/93 | HR 84 | Temp 98.6°F | Resp 16 | Ht 67.5 in | Wt 217.0 lb

## 2015-01-21 DIAGNOSIS — M79642 Pain in left hand: Secondary | ICD-10-CM

## 2015-01-21 DIAGNOSIS — I10 Essential (primary) hypertension: Secondary | ICD-10-CM

## 2015-01-21 DIAGNOSIS — M79641 Pain in right hand: Secondary | ICD-10-CM

## 2015-01-21 DIAGNOSIS — Z23 Encounter for immunization: Secondary | ICD-10-CM

## 2015-01-21 DIAGNOSIS — M7989 Other specified soft tissue disorders: Secondary | ICD-10-CM

## 2015-01-21 DIAGNOSIS — Z131 Encounter for screening for diabetes mellitus: Secondary | ICD-10-CM

## 2015-01-21 DIAGNOSIS — E785 Hyperlipidemia, unspecified: Secondary | ICD-10-CM | POA: Insufficient documentation

## 2015-01-21 LAB — COMPREHENSIVE METABOLIC PANEL
ALT: <8 U/L (ref 0–35)
AST: 12 U/L (ref 0–37)
Albumin: 3.9 g/dL (ref 3.5–5.2)
Alkaline Phosphatase: 63 U/L (ref 39–117)
BUN: 12 mg/dL (ref 6–23)
CALCIUM: 8.9 mg/dL (ref 8.4–10.5)
CHLORIDE: 103 meq/L (ref 96–112)
CO2: 24 meq/L (ref 19–32)
CREATININE: 0.76 mg/dL (ref 0.50–1.10)
Glucose, Bld: 94 mg/dL (ref 70–99)
Potassium: 4.1 mEq/L (ref 3.5–5.3)
Sodium: 136 mEq/L (ref 135–145)
Total Bilirubin: 0.6 mg/dL (ref 0.2–1.2)
Total Protein: 6.7 g/dL (ref 6.0–8.3)

## 2015-01-21 LAB — LIPID PANEL
Cholesterol: 235 mg/dL — ABNORMAL HIGH (ref 0–200)
HDL: 53 mg/dL (ref >39)
LDL Cholesterol: 164 mg/dL — ABNORMAL HIGH (ref 0–99)
Total CHOL/HDL Ratio: 4.4 Ratio
Triglycerides: 91 mg/dL (ref <150)
VLDL: 18 mg/dL (ref 0–40)

## 2015-01-21 LAB — TSH: TSH: 1.662 u[IU]/mL (ref 0.350–4.500)

## 2015-01-21 LAB — POCT URINALYSIS DIPSTICK
BILIRUBIN UA: NEGATIVE
GLUCOSE UA: NEGATIVE
Ketones, UA: NEGATIVE
Leukocytes, UA: NEGATIVE
Nitrite, UA: NEGATIVE
Protein, UA: NEGATIVE
RBC UA: NEGATIVE
SPEC GRAV UA: 1.02
UROBILINOGEN UA: 0.2
pH, UA: 7

## 2015-01-21 LAB — POCT UA - MICROSCOPIC ONLY
Casts, Ur, LPF, POC: NEGATIVE
Crystals, Ur, HPF, POC: NEGATIVE
Mucus, UA: POSITIVE
Yeast, UA: NEGATIVE

## 2015-01-21 LAB — POCT GLYCOSYLATED HEMOGLOBIN (HGB A1C): Hemoglobin A1C: 5.4

## 2015-01-21 LAB — C-REACTIVE PROTEIN: CRP: 0.5 mg/dL (ref <0.60)

## 2015-01-21 MED ORDER — ACETAMINOPHEN 500 MG PO TABS: 1000.0000 mg | ORAL_TABLET | Freq: Three times a day (TID) | ORAL | Status: DC | PRN

## 2015-01-21 NOTE — Progress Notes (Signed)
01/21/2015 at 11:31 AM  Alexandria Clark / DOB: 09/09/1966 / MRN: [id number]  The patient has Hypertension; Migraine; Anxiety; Palpitations; and Dyslipidemia on her problem list.  SUBJECTIVE  Chief compalaint: Annual Exam   History of present illness: Alexandria Clark is 49 y.o. well appearing non smoking female presenting for an annual physical exam.    With regard to colonoscopy,she is due for this next year and is amenable to receiving it.    With regard to pap she receives care for this at GYN, and was screened negative two years ago.  She received her last mammogram roughly 2 years ago, and all was normal. She will work with her GYN to schedule this in June.  She denies breast pain and breast and nipple changes.  She denies constitutional symptoms today.    With regard to immunizations, she has not gotten the flu shot but is amenable to this today. She received a TDAP after her daughter was born 5 years ago.   She reports left leg swelling and finger and hand pain (which started first) and thinks it may be related to RA, as she has some aunts who are diagnosed with the condition. She does complain of bilateral wrist pain and PIP pain.  This problem has been gradual in onset and has been present for a year.  She denies SOB, DOE, chest pain, orthopnea, and decreased exercise tolerance.    She has a history of HTN, and is compliant with lisinopril qd.  She checks her BP 3 to 4 times per week and reports averages of 120/80.    She  has a past medical history of Hypertension; Other and unspecified hyperlipidemia; Migraine; and Anxiety.    She has a current medication list which includes the following prescription(s): cholecalciferol, cholecalciferol, and lisinopril.  Alexandria Clark is allergic to crestor; enalapril; fish oil; and metoprolol. She  reports that she has never smoked. She does not have any smokeless tobacco history on file. She reports that she does not drink alcohol or use illicit  drugs. She  reports that she currently engages in sexual activity. The patient  has past surgical history that includes Cesarean section (05/2008).  Her family history includes Cancer (age of onset: 54) in her maternal aunt; Cancer (age of onset: 43) in her mother; Diabetes in her cousin and maternal uncle; Drug abuse in her maternal uncle; Heart disease in her maternal grandmother and maternal uncle; Hyperlipidemia in her maternal aunt and mother; Hypertension in her maternal aunt, maternal uncle, and mother; Stroke (age of onset: 12) in her maternal grandmother.  Review of Systems  Constitutional: Negative.   HENT: Negative.   Respiratory: Negative.   Cardiovascular: Negative.   Gastrointestinal: Negative.   Genitourinary: Negative.   Musculoskeletal: Positive for joint pain.  Skin: Negative.   Neurological: Negative.     OBJECTIVE  Her  height is 5' 7.5" (1.715 m) and weight is 217 lb (98.431 kg). Her oral temperature is 98.6 F (37 C). Her blood pressure is 149/93 and her pulse is 84. Her respiration is 16 and oxygen saturation is 100%.  The patient's body mass index is 33.47 kg/(m^2).  Physical Exam  Constitutional: She is oriented to person, place, and time. She appears well-developed and well-nourished.  HENT:  Head: Normocephalic.  Right Ear: Hearing, tympanic membrane, external ear and ear canal normal.  Left Ear: Hearing, tympanic membrane, external ear and ear canal normal.  Nose: Nose normal.  Mouth/Throat: Uvula is midline, oropharynx is  clear and moist and mucous membranes are normal.  Cardiovascular: Normal rate and regular rhythm.   Respiratory: Effort normal and breath sounds normal.  GI: Soft. Bowel sounds are normal.  Musculoskeletal:       Arms: Neurological: She is alert and oriented to person, place, and time. No cranial nerve deficit.  Skin: Skin is warm and dry.     Psychiatric: She has a normal mood and affect.   EKG: WNL, - for LVH and axis  deviation  [facility] reading (PRIMARY) by  Dr. Audria NineMcPherson: Negative for bony abnormalities.      Results for orders placed or performed in visit on 01/21/15 (from the past 24 hour(s))  POCT glycosylated hemoglobin (Hb A1C)     Status: Normal   Collection Time: 01/21/15 11:25 AM  Result Value Ref Range   Hemoglobin A1C 5.4   POCT urinalysis dipstick     Status: Normal   Collection Time: 01/21/15 11:26 AM  Result Value Ref Range   Color, UA yellow    Clarity, UA clear    Glucose, UA neg    Bilirubin, UA neg    Ketones, UA neg    Spec Grav, UA 1.020    Blood, UA neg    pH, UA 7.0    Protein, UA neg    Urobilinogen, UA 0.2    Nitrite, UA neg    Leukocytes, UA Negative   POCT UA - Microscopic Only     Status: Normal   Collection Time: 01/21/15 11:26 AM  Result Value Ref Range   WBC, Ur, HPF, POC 0-1    RBC, urine, microscopic 2-3    Bacteria, U Microscopic 1+    Mucus, UA pos    Epithelial cells, urine per micros 1-2    Crystals, Ur, HPF, POC neg    Casts, Ur, LPF, POC neg    Yeast, UA neg     ASSESSMENT & PLAN  Alexandria Clark was seen today for annual exam.  Diagnoses and all orders for this visit:  Essential hypertension: BP slightly elevated today, but per pts report well controlled at home.  EKG reassuring.  Continue current plan.  Orders: -     POCT urinalysis dipstick -     POCT UA - Microscopic Only: appears to be a dirty catch -     POCT CBC -     Comprehensive metabolic panel -     TSH -     EKG 12-Lead  Dyslipidemia: Historically her lipid levels would not necessitate therapy.  Will hold therapy unless ASCVD >7.5, or LDL reaches 190.   Orders: -     Lipid panel  Screening for diabetes mellitus Orders: -     POCT glycosylated hemoglobin (Hb A1C)  Bilateral hand pain: Given patient's HPI and distribution of her pain and PE finding I am concerned for the possibility of RA.  I will have a low threshold for rheum referral pending inflammatory markers.   Orders: -      POCT SEDIMENTATION RATE -     C-reactive protein -     DG Hand Complete Left; Future -     DG Hand Complete Right; Future  Need for influenza vaccination Orders: -     Flu Vaccine QUAD 36+ mos IM  Leg swelling: EKG pointing away from a cardiac etiology and this does not appear to be iatrogenic.  DDx: physiologic 2/2 body habitus vs venous stasis vs liver etiology vs renal etiology.  Orders: -  EKG 12-Lead -     CMET    The patient was advised to call or come back to clinic if she does not see an improvement in symptoms, or worsens with the above plan.   Deliah Boston, MHS, PA-C [facility] 01/21/2015 11:31 AM

## 2015-02-01 ENCOUNTER — Other Ambulatory Visit: Payer: Self-pay | Admitting: Physician Assistant

## 2015-03-11 ENCOUNTER — Ambulatory Visit (INDEPENDENT_AMBULATORY_CARE_PROVIDER_SITE_OTHER): Payer: 59 | Admitting: Family Medicine

## 2015-03-11 VITALS — BP 166/108 | HR 98 | Temp 97.5°F | Resp 18 | Ht 67.75 in | Wt 220.6 lb

## 2015-03-11 DIAGNOSIS — I10 Essential (primary) hypertension: Secondary | ICD-10-CM

## 2015-03-11 DIAGNOSIS — R635 Abnormal weight gain: Secondary | ICD-10-CM

## 2015-03-11 DIAGNOSIS — Z5181 Encounter for therapeutic drug level monitoring: Secondary | ICD-10-CM | POA: Diagnosis not present

## 2015-03-11 DIAGNOSIS — E669 Obesity, unspecified: Secondary | ICD-10-CM | POA: Insufficient documentation

## 2015-03-11 MED ORDER — LISINOPRIL-HYDROCHLOROTHIAZIDE 10-12.5 MG PO TABS: 1.0000 | ORAL_TABLET | Freq: Every day | ORAL | Status: DC

## 2015-03-11 NOTE — Progress Notes (Signed)
[facility] [address] [place] [address] [phone]  Date:  03/11/2015   Name:  Alexandria Clark   DOB:  [birth year]   MRN:  [id number]  PCP:  Lamar Blinks, MD    Chief Complaint: Hypertension   History of Present Illness:  Alexandria Clark is a 49 y.o. very pleasant female patient who presents with the following:  Here today to follow-up her HTN.  She is on lisinopril for a couple of years.  Over the last week or two she has noted that her BP is running higher.  Resting does bring it down.   She might get 157/100-111 at home which has her concerned.   She has had HTN for approx 6-7 years.  There is a family history of HTN.   She has had some leg swelling in the past.  Negative eval for RA but she does think she may have some sort of arthritis.  She might like to use a diuretic for her occasional edema She has not noted any CP or SOB, no HA or blurred vision She has been under some stress- her mother has been treated for cancer over the last year which has been stressful although she is now cancer free  She is able to check her BP at home Wt Readings from Last 3 Encounters:  03/11/15 220 lb 9.6 oz (100.064 kg)  01/21/15 217 lb (98.431 kg)  12/22/13 212 lb (96.163 kg)     BP Readings from Last 3 Encounters:  03/11/15 166/108  01/21/15 149/93  12/22/13 135/87     Patient Active Problem List   Diagnosis Date Noted  . Dyslipidemia 01/21/2015  . Palpitations 01/29/2012  . Anxiety 01/24/2012  . Hypertension   . Migraine     Past Medical History  Diagnosis Date  . Hypertension   . Other and unspecified hyperlipidemia   . Migraine     ocp induced  . Anxiety     Past Surgical History  Procedure Laterality Date  . Cesarean section  05/2008    History  Substance Use Topics  . Smoking status: Never Smoker   . Smokeless tobacco: Not on file  . Alcohol Use: No    Family History  Problem Relation Age of Onset  . Hypertension Mother    . Hyperlipidemia Mother   . Cancer Mother 3    Breast  . Hypertension Maternal Aunt   . Hyperlipidemia Maternal Aunt   . Cancer Maternal Aunt 55    Breast; Multiple Myeloma  . Hypertension Maternal Uncle   . Diabetes Maternal Uncle   . Drug abuse Maternal Uncle   . Heart disease Maternal Uncle   . Heart disease Maternal Grandmother     MI @ 41- survived  . Stroke Maternal Grandmother 66  . Diabetes Cousin     Allergies  Allergen Reactions  . Crestor [Rosuvastatin]   . Enalapril Other (See Comments)    Dizziness  . Fish Oil   . Metoprolol Other (See Comments)    Dizziness and hair loss    Medication list has been reviewed and updated.  Current Outpatient Prescriptions on File Prior to Visit  Medication Sig Dispense Refill  . acetaminophen (TYLENOL) 500 MG tablet Take 2 tablets (1,000 mg total) by mouth every 8 (eight) hours as needed for mild pain or moderate pain. Take 2 tabs every 8 hours. 30 tablet 3  . cholecalciferol (VITAMIN D) 1000 UNITS tablet Take 1,000 Units by mouth  daily.    . lisinopril (PRINIVIL,ZESTRIL) 10 MG tablet Take 1 tablet (10 mg total) by mouth daily. 30 tablet 5   No current facility-administered medications on file prior to visit.    Review of Systems:  As per HPI- otherwise negative.   Physical Examination: Filed Vitals:   03/11/15 0856  BP: 166/108  Pulse: 98  Temp: 97.5 F (36.4 C)  Resp: 18   Filed Vitals:   03/11/15 0856  Height: 5' 7.75" (1.721 m)  Weight: 220 lb 9.6 oz (100.064 kg)   Body mass index is 33.78 kg/(m^2). Ideal Body Weight: Weight in (lb) to have BMI = 25: 162.9  GEN: WDWN, NAD, Non-toxic, A & O x 3, obese, looks well HEENT: Atraumatic, Normocephalic. Neck supple. No masses, No LAD. Ears and Nose: No external deformity. CV: RRR, No M/G/R. No JVD. No thrill. No extra heart sounds. PULM: CTA B, no wheezes, crackles, rhonchi. No retractions. No resp. distress. No accessory muscle use. EXTR: No c/c/e NEURO  Normal gait.  PSYCH: Normally interactive. Conversant. Not depressed or anxious appearing.  Calm demeanor.    Assessment and Plan: Essential hypertension - Plan: lisinopril-hydrochlorothiazide (PRINZIDE,ZESTORETIC) 10-12.5 MG per tablet  Medication monitoring encounter - Plan: Basic metabolic panel  Weight gain  Will add HCTZ to her regimen,and she will keep me up to date on her BP readings at home.  She will come in for labs only in a few weeks Admits hat she is drinking about 5 regular mountain dews a day, and has put on some weight,  She will work on this. Encouraged her to stop the sodas  Signed Lamar Blinks, MD

## 2015-03-11 NOTE — Patient Instructions (Signed)
Please give me an update via mychart in a few days Start the new BP medication- lisinopril/hctz Please come by clinic for a lab draw in 3-4 weeks to check your potassium Remember that you cannot be pregnant while on lisinopril.  If you do suspect pregnancy take a home test and if positive stop the medication and seek care.

## 2015-07-21 IMAGING — CR DG HAND COMPLETE 3+V*R*
3 series · 3 of 3 positions shown · non-contrast
Comparison: None.

CLINICAL DATA: Right hand pain swelling.

EXAM:
RIGHT HAND - COMPLETE 3+ VIEW

[lateral]
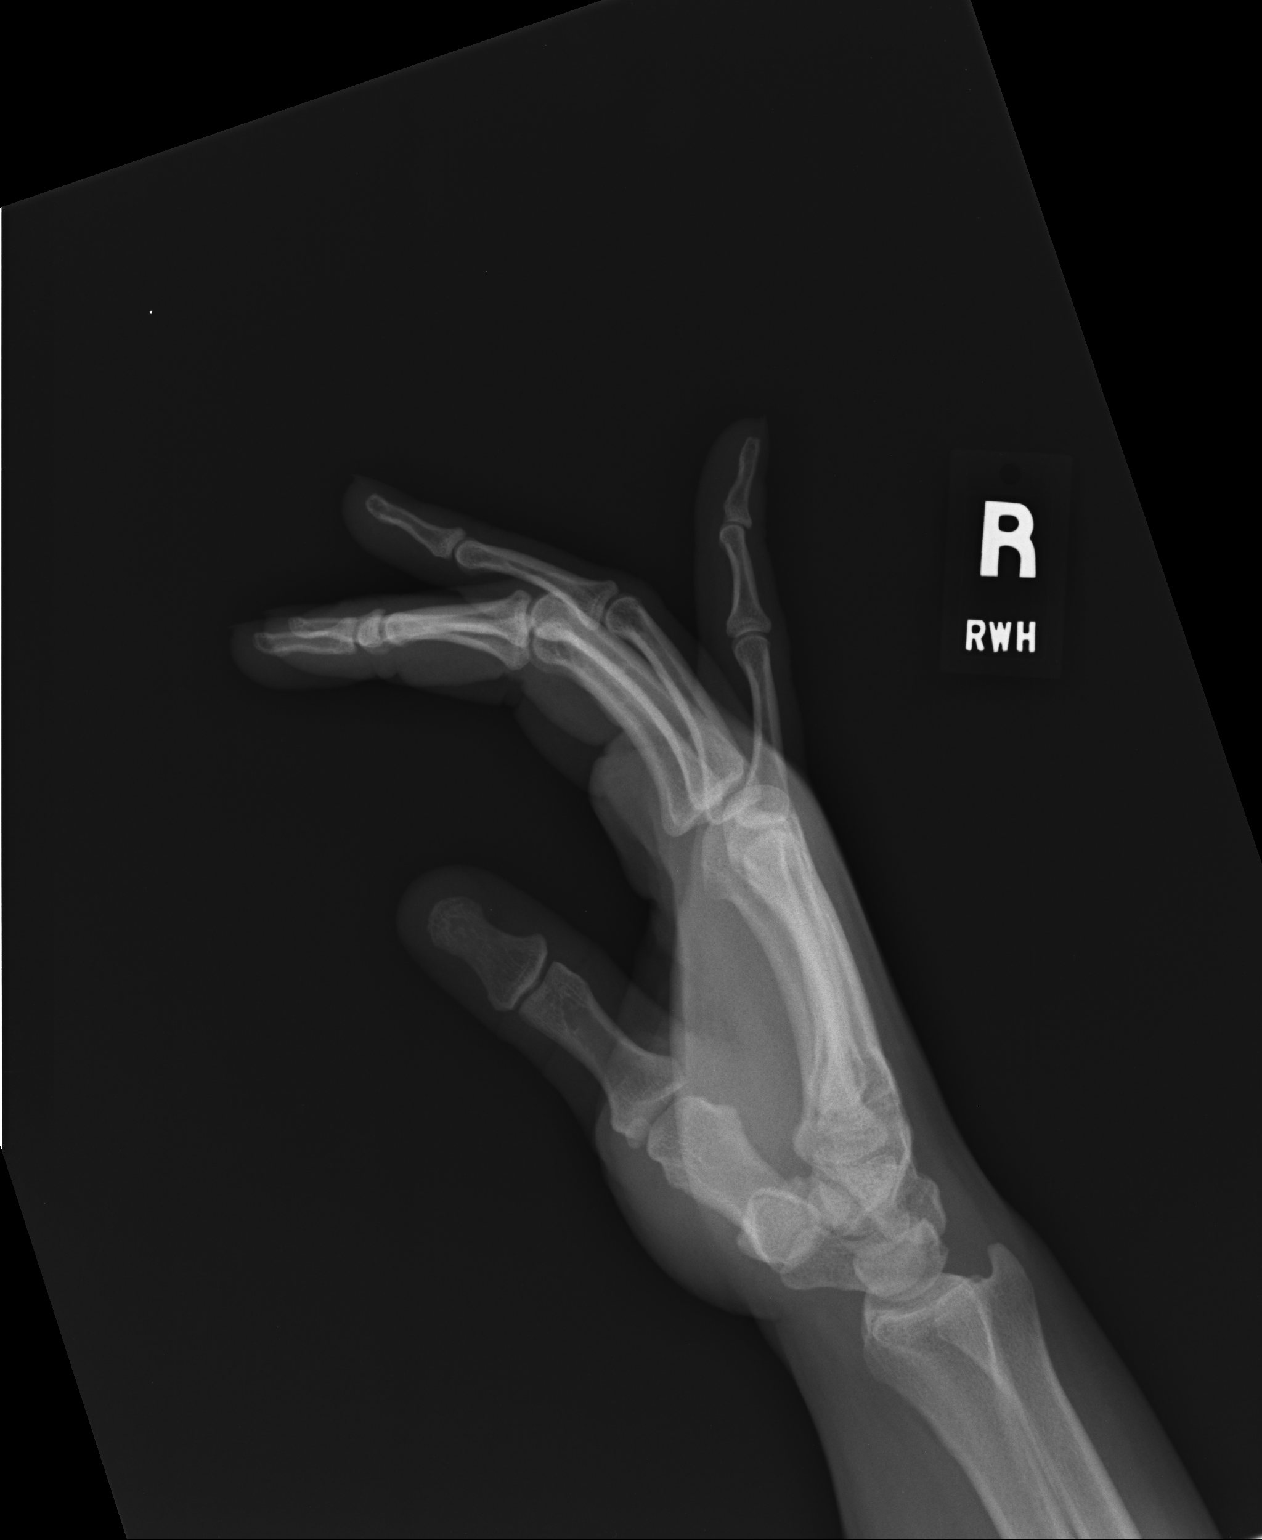

[oblique]
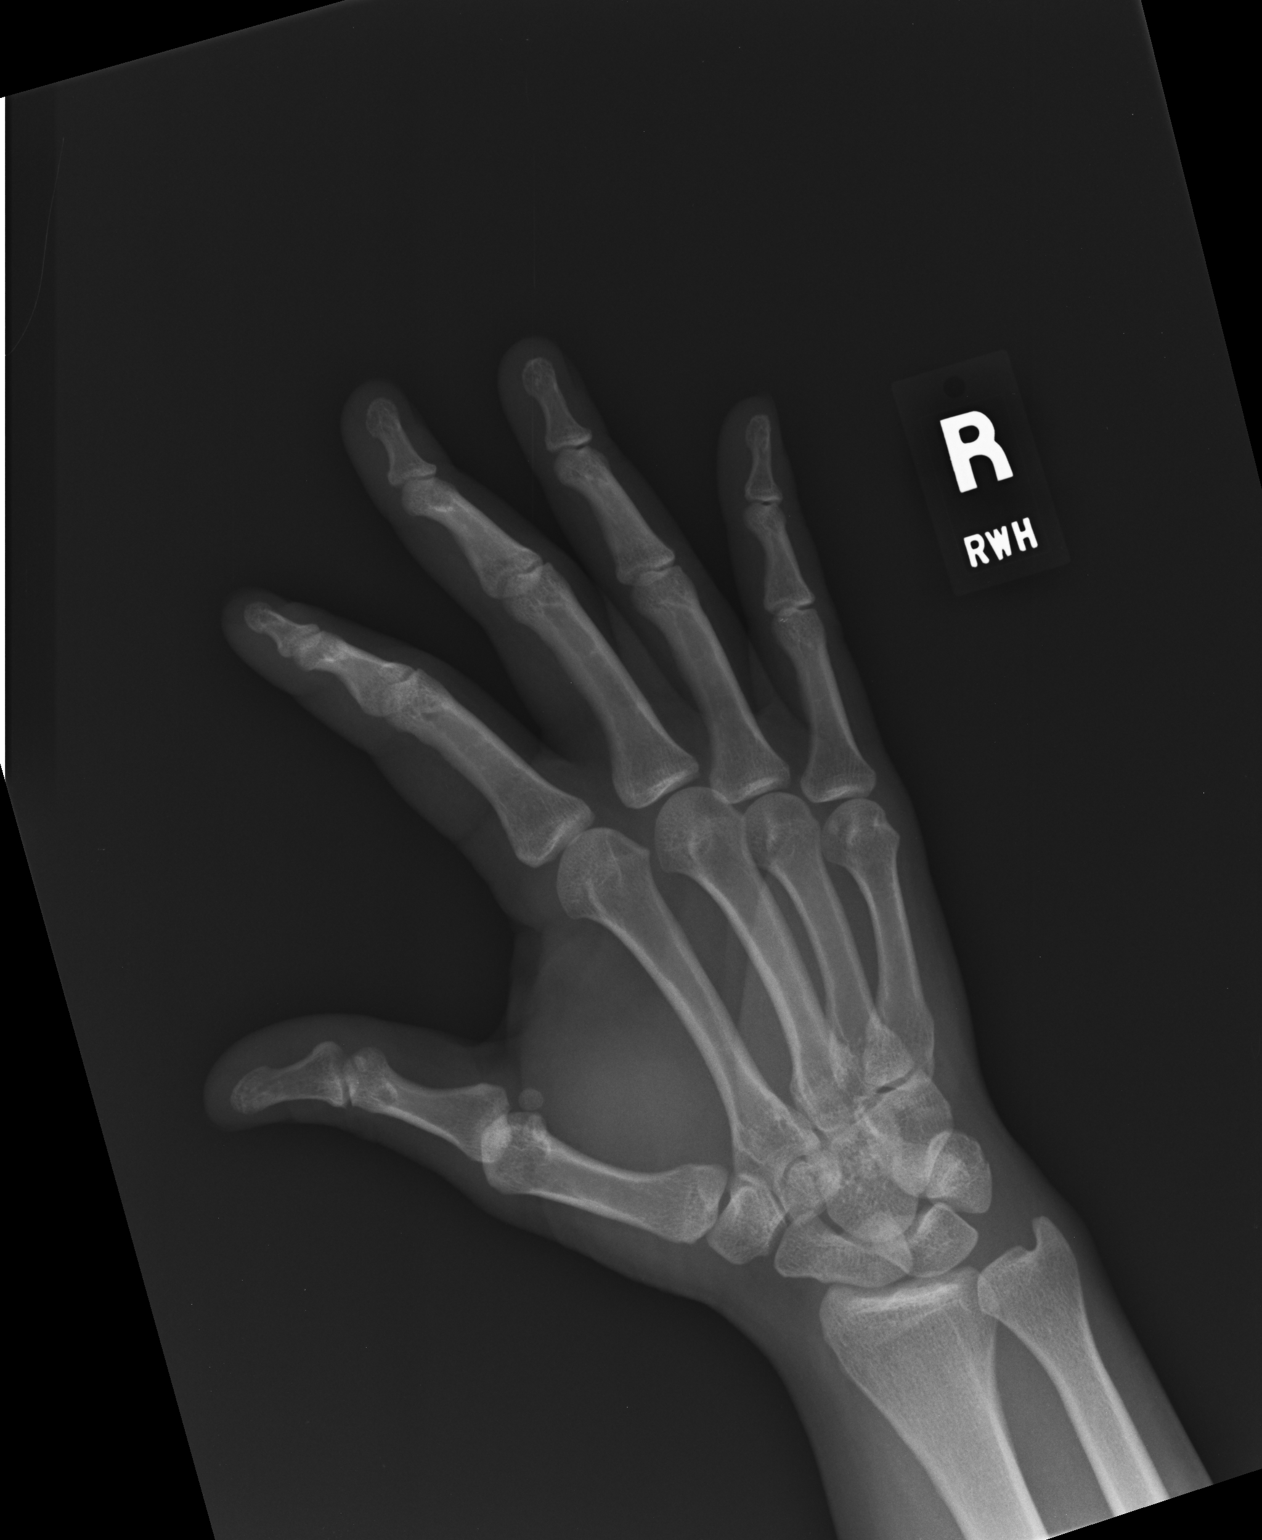

[PA]
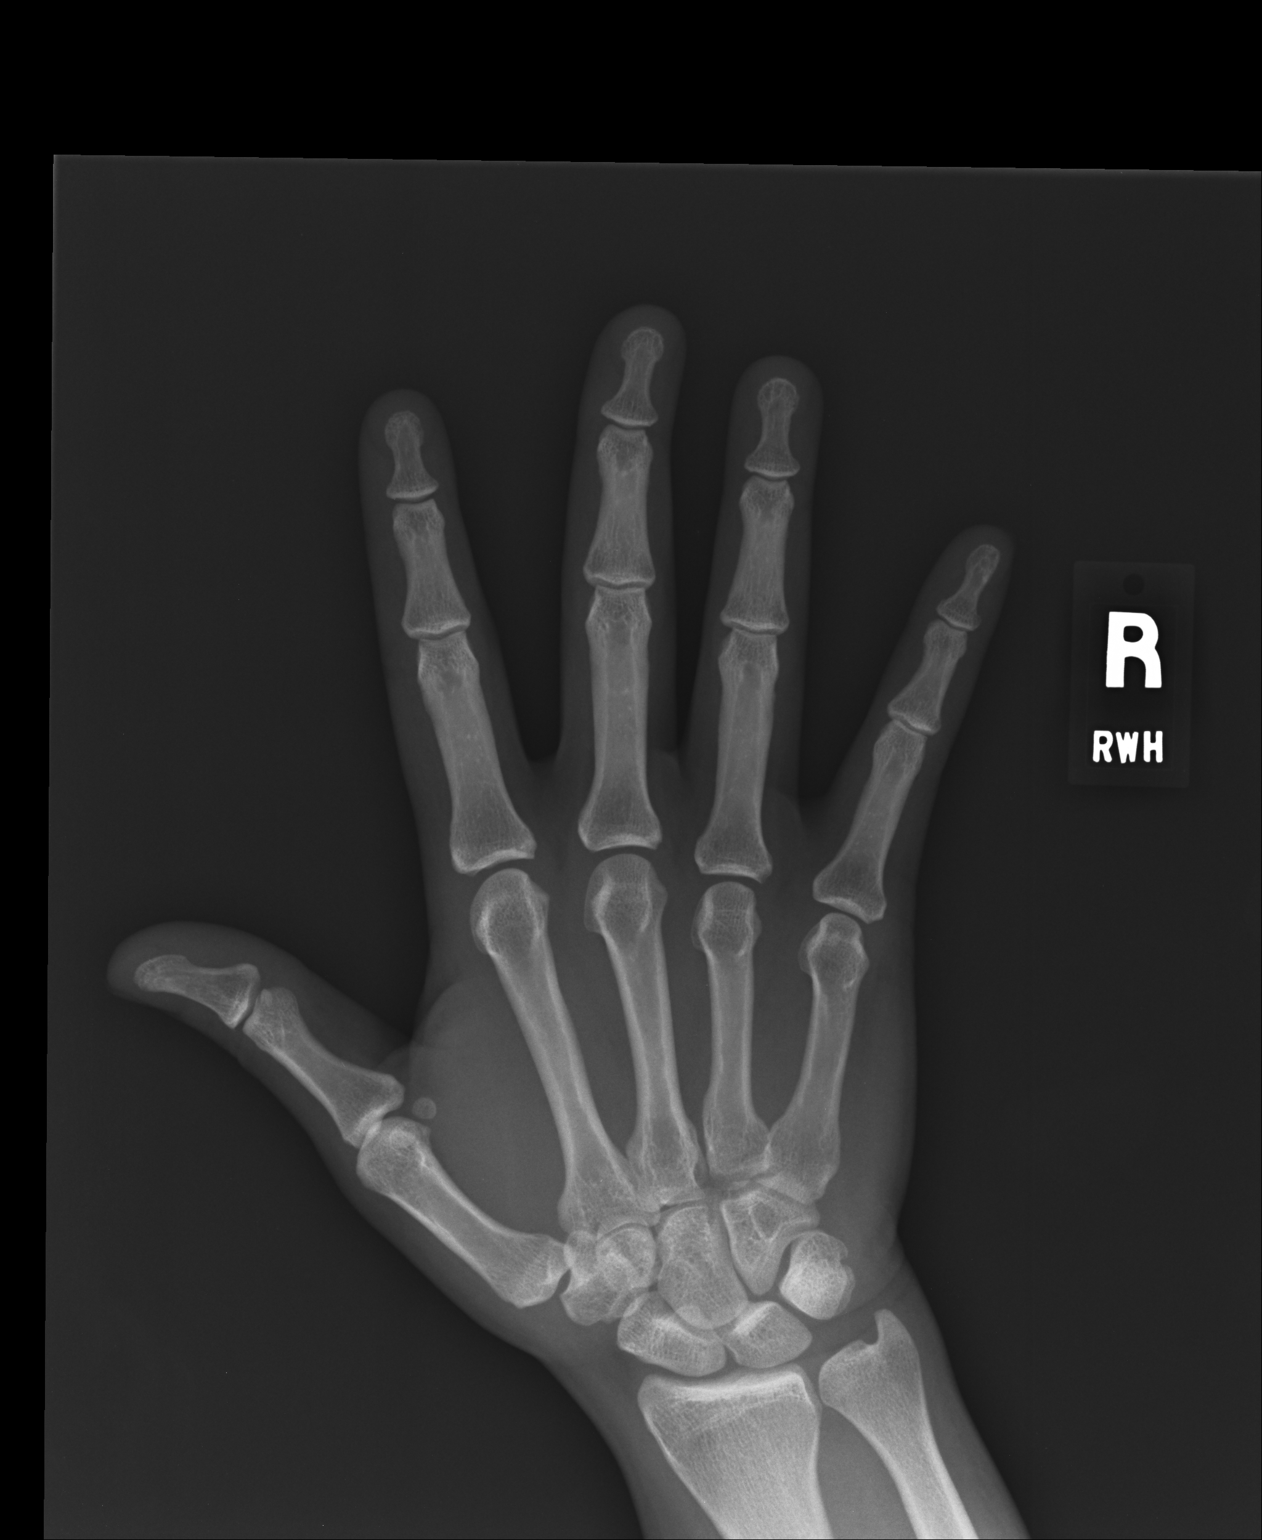

[3 of 3 positions shown; findings below may reference images not displayed]

FINDINGS: No fracture or dislocation is identified. No significant
proliferative or erosive arthropathy. No bony lesions. Soft tissues
are unremarkable.
IMPRESSION: Normal right hand.

## 2015-07-29 ENCOUNTER — Other Ambulatory Visit: Payer: Self-pay | Admitting: Physician Assistant

## 2015-12-23 ENCOUNTER — Encounter: Payer: Self-pay | Admitting: Family Medicine

## 2015-12-28 ENCOUNTER — Encounter: Payer: Self-pay | Admitting: Family Medicine

## 2016-03-19 ENCOUNTER — Telehealth: Payer: Self-pay

## 2016-03-19 DIAGNOSIS — I10 Essential (primary) hypertension: Secondary | ICD-10-CM

## 2016-03-19 MED ORDER — LISINOPRIL-HYDROCHLOROTHIAZIDE 10-12.5 MG PO TABS: 1.0000 | ORAL_TABLET | Freq: Every day | ORAL | Status: DC

## 2016-03-19 NOTE — Telephone Encounter (Signed)
Pt is needing a refill on lisinopril -hctz  Best number [phone]

## 2016-03-19 NOTE — Telephone Encounter (Signed)
Rx sent, advised pt to establish care before Rx runs out. Pt understood.

## 2016-04-15 ENCOUNTER — Other Ambulatory Visit: Payer: Self-pay | Admitting: Physician Assistant

## 2016-06-10 ENCOUNTER — Other Ambulatory Visit: Payer: Self-pay | Admitting: Physician Assistant

## 2016-07-11 ENCOUNTER — Other Ambulatory Visit: Payer: Self-pay | Admitting: Physician Assistant

## 2016-07-12 ENCOUNTER — Other Ambulatory Visit: Payer: Self-pay | Admitting: Family Medicine

## 2016-07-12 MED ORDER — LISINOPRIL-HYDROCHLOROTHIAZIDE 10-12.5 MG PO TABS: 1.0000 | ORAL_TABLET | Freq: Every day | ORAL | 0 refills | Status: DC

## 2016-07-12 NOTE — Telephone Encounter (Signed)
LMOM for pt reminding her (she was advised in April by ph and then on RF in June) that she is overdue for f/up. Asked for CB to get same day appt sch or to call with f/up plan, may be able to get her a few tabs to cover her until she can get in.

## 2016-07-12 NOTE — Telephone Encounter (Signed)
Ok to refill 

## 2016-07-12 NOTE — Telephone Encounter (Signed)
Pt calling for a refill on her Lisinopril/Hctz 10/12.5 mg until she make an appt on the 25th

## 2016-07-12 NOTE — Telephone Encounter (Signed)
Refill provided to patient so that she can make her appointment.

## 2016-07-12 NOTE — Telephone Encounter (Signed)
Notified pt of RF and she agreed she will call and come in on the 28th.

## 2016-08-09 ENCOUNTER — Other Ambulatory Visit: Payer: Self-pay | Admitting: Urgent Care

## 2016-08-16 ENCOUNTER — Ambulatory Visit (INDEPENDENT_AMBULATORY_CARE_PROVIDER_SITE_OTHER): Payer: 59 | Admitting: Family Medicine

## 2016-08-16 VITALS — BP 116/80 | HR 90 | Temp 98.1°F | Resp 17 | Ht 67.5 in | Wt 230.0 lb

## 2016-08-16 DIAGNOSIS — I10 Essential (primary) hypertension: Secondary | ICD-10-CM | POA: Diagnosis not present

## 2016-08-16 DIAGNOSIS — E785 Hyperlipidemia, unspecified: Secondary | ICD-10-CM | POA: Diagnosis not present

## 2016-08-16 LAB — CBC
HEMATOCRIT: 38.2 % (ref 35.0–45.0)
HEMOGLOBIN: 12.2 g/dL (ref 11.7–15.5)
MCH: 22.3 pg — AB (ref 27.0–33.0)
MCHC: 31.9 g/dL — ABNORMAL LOW (ref 32.0–36.0)
MCV: 70 fL — AB (ref 80.0–100.0)
MPV: 10.8 fL (ref 7.5–12.5)
Platelets: 269 10*3/uL (ref 140–400)
RBC: 5.46 MIL/uL — AB (ref 3.80–5.10)
RDW: 14.1 % (ref 11.0–15.0)
WBC: 4 10*3/uL (ref 3.8–10.8)

## 2016-08-16 LAB — COMPREHENSIVE METABOLIC PANEL
ALBUMIN: 3.9 g/dL (ref 3.6–5.1)
ALK PHOS: 61 U/L (ref 33–130)
ALT: 10 U/L (ref 6–29)
AST: 14 U/L (ref 10–35)
BUN: 12 mg/dL (ref 7–25)
CHLORIDE: 107 mmol/L (ref 98–110)
CO2: 29 mmol/L (ref 20–31)
Calcium: 9.4 mg/dL (ref 8.6–10.4)
Creat: 0.82 mg/dL (ref 0.50–1.05)
Glucose, Bld: 102 mg/dL — ABNORMAL HIGH (ref 65–99)
POTASSIUM: 4.1 mmol/L (ref 3.5–5.3)
Sodium: 142 mmol/L (ref 135–146)
TOTAL PROTEIN: 6.6 g/dL (ref 6.1–8.1)
Total Bilirubin: 0.5 mg/dL (ref 0.2–1.2)

## 2016-08-16 LAB — LIPID PANEL
CHOL/HDL RATIO: 3.9 ratio (ref <=5.0)
CHOLESTEROL: 224 mg/dL — AB (ref 125–200)
HDL: 58 mg/dL (ref >=46)
LDL Cholesterol: 143 mg/dL — ABNORMAL HIGH (ref <130)
TRIGLYCERIDES: 117 mg/dL (ref <150)
VLDL: 23 mg/dL (ref <30)

## 2016-08-16 MED ORDER — LISINOPRIL-HYDROCHLOROTHIAZIDE 10-12.5 MG PO TABS: 1.0000 | ORAL_TABLET | Freq: Every day | ORAL | 2 refills | Status: DC

## 2016-08-16 NOTE — Patient Instructions (Addendum)
Refill for blood pressure today.  We are checking yearly blood work. You can check this by Mychart. If something is off I'll touch base with you.  It was good to see you today!    IF you received an x-ray today, you will receive an invoice from [facility]. Please contact [facility] at [phone] with questions or concerns regarding your invoice.   IF you received labwork today, you will receive an invoice from [facility]. Please contact [facility] at [phone] with questions or concerns regarding your invoice.   Our billing staff will not be able to assist you with questions regarding bills from these companies.  You will be contacted with the lab results as soon as they are available. The fastest way to get your results is to activate your My Chart account. Instructions are located on the last page of this paperwork. If you have not heard from us regarding the results in 2 weeks, please contact this office.     We recommend that you schedule a mammogram for breast cancer screening. Typically, you do not need a referral to do this. Please contact a local imaging center to schedule your mammogram.  [facility] - [phone]) [phone]  *ask for the Radiology Department [facility] - [phone] or [phone]) [phone]  [facility] - [phone] [facility] - [phone] [facility] - [phone]) [phone]  *ask for the Radiology Department [facility] - [phone]  *ask for the Radiology Department [facility] - [phone]) [phone]  *ask for the Mammography Department [facility] - [phone]

## 2016-08-16 NOTE — Progress Notes (Signed)
Alexandria Clark is a 50 y.o. female who presents to [facility] today for HTN/blood pressure medication refill  1.   Hypertension:  Long-term problem for this patient.  No adverse effects from medication.  Not checking it regularly.  No HA, CP, dizziness, shortness of breath, palpitations, or LE swelling.  Taking regularly.  Will be out of her medication soon. BP Readings from Last 3 Encounters:  08/16/16 116/80  03/11/15 (!) 166/108  01/21/15 (!) 149/93   2.   HLD:  Last lipid panel listed below.    Currently is not on Statin.  No chest pain as above.  Has trouble tolerating statins previously.  Not on any medications for this at all.   Lab Results  Component Value Date   CHOL 235 (H) 01/21/2015   CHOL 216 (H) 12/22/2013   CHOL 247 (H) 12/10/2012   Lab Results  Component Value Date   HDL 53 01/21/2015   HDL 61 12/22/2013   HDL 56 12/10/2012   Lab Results  Component Value Date   LDLCALC 164 (H) 01/21/2015   LDLCALC 143 (H) 12/22/2013   LDLCALC 176 (H) 12/10/2012   Lab Results  Component Value Date   TRIG 91 01/21/2015   TRIG 58 12/22/2013   TRIG 73 12/10/2012   Lab Results  Component Value Date   CHOLHDL 4.4 01/21/2015   CHOLHDL 3.5 12/22/2013   CHOLHDL 4.4 12/10/2012     ROS as above.  Pertinently, no chest pain, palpitations, SOB, Fever, Chills, Abd pain, N/V/D.   PMH reviewed. Patient is a nonsmoker.   Past Medical History:  Diagnosis Date  . Anxiety   . Hypertension   . Migraine    ocp induced  . Other and unspecified hyperlipidemia    Past Surgical History:  Procedure Laterality Date  . CESAREAN SECTION  05/2008    Medications reviewed. Current Outpatient Prescriptions  Medication Sig Dispense Refill  . acetaminophen (TYLENOL) 500 MG tablet Take 2 tablets (1,000 mg total) by mouth every 8 (eight) hours as needed for mild pain or moderate pain. Take 2 tabs every 8 hours. 30 tablet 3  . cholecalciferol (VITAMIN D) 1000 UNITS  tablet Take 1,000 Units by mouth daily.    Marland Kitchen. lisinopril-hydrochlorothiazide (PRINZIDE,ZESTORETIC) 10-12.5 MG tablet Take 1 tablet by mouth daily. 30 tablet 0   No current facility-administered medications for this visit.      Physical Exam:  BP 116/80 (BP Location: Right Arm, Patient Position: Sitting, Cuff Size: Large)   Pulse 90   Temp 98.1 F (36.7 C) (Oral)   Resp 17   Ht 5' 7.5" (1.715 m)   Wt 230 lb (104.3 kg)   SpO2 99%   BMI 35.49 kg/m  Gen:  Alert, cooperative patient who appears stated age in no acute distress.  Vital signs reviewed. HEENT: EOMI,  MMM Pulm:  Clear to auscultation bilaterally with good air movement.  No wheezes or rales noted.   Cardiac:  Regular rate and rhythm without murmur auscultated.  Good S1/S2. Abd:  Soft/nondistended/nontender.  Good bowel sounds throughout all four quadrants.  No masses noted.  Exts: No LE edema.  Assessment and Plan:  1.  HTN: - no red flags.  Refill for meds today.  - preventative medicine handled by her gynecologist.  FU with him later this year for Pap/mammogram - doing well, no concerns. FU in 1 year.  Has "allergy" to enalapril but has been on lisinopril without concern or issues for  years.  2.  HLD: - checking lipids again today. - high in past.  Not on anything

## 2017-05-18 ENCOUNTER — Other Ambulatory Visit: Payer: Self-pay | Admitting: Family Medicine

## 2017-05-23 ENCOUNTER — Other Ambulatory Visit: Payer: Self-pay | Admitting: Family Medicine

## 2017-08-21 ENCOUNTER — Encounter: Payer: Self-pay | Admitting: Family Medicine

## 2017-08-21 ENCOUNTER — Ambulatory Visit (INDEPENDENT_AMBULATORY_CARE_PROVIDER_SITE_OTHER): Payer: 59 | Admitting: Family Medicine

## 2017-08-21 ENCOUNTER — Telehealth: Payer: Self-pay | Admitting: Family Medicine

## 2017-08-21 VITALS — BP 120/70 | HR 82 | Temp 98.5°F | Resp 16 | Ht 67.5 in | Wt 238.4 lb

## 2017-08-21 DIAGNOSIS — Z1211 Encounter for screening for malignant neoplasm of colon: Secondary | ICD-10-CM | POA: Diagnosis not present

## 2017-08-21 DIAGNOSIS — Z1231 Encounter for screening mammogram for malignant neoplasm of breast: Secondary | ICD-10-CM

## 2017-08-21 DIAGNOSIS — Z23 Encounter for immunization: Secondary | ICD-10-CM | POA: Diagnosis not present

## 2017-08-21 DIAGNOSIS — Z5181 Encounter for therapeutic drug level monitoring: Secondary | ICD-10-CM

## 2017-08-21 DIAGNOSIS — I10 Essential (primary) hypertension: Secondary | ICD-10-CM | POA: Diagnosis not present

## 2017-08-21 DIAGNOSIS — R51 Headache: Secondary | ICD-10-CM

## 2017-08-21 DIAGNOSIS — R519 Headache, unspecified: Secondary | ICD-10-CM

## 2017-08-21 MED ORDER — LISINOPRIL-HYDROCHLOROTHIAZIDE 10-12.5 MG PO TABS: 1.0000 | ORAL_TABLET | Freq: Every day | ORAL | 1 refills | Status: DC

## 2017-08-21 NOTE — Telephone Encounter (Signed)
Pt was seem by dr Creta Levin today and was supposed the have a refill on blood pressure meds called in and has not been sent to pharmacy yet   Best number [phone]

## 2017-08-21 NOTE — Progress Notes (Signed)
Chief Complaint  Patient presents with  . Medication Refill    lisinopril-hydorchlorothiazide, wants a higher dose    HPI  Hypertension: Patient here for follow-up of elevated blood pressure. She is not exercising and is adherent to low salt diet.  Blood pressure is well controlled at home. Cardiac symptoms none. Patient denies chest pain, dyspnea, exertional chest pressure/discomfort, fatigue and irregular heart beat.  Cardiovascular risk factors: hypertension, obesity (BMI >= 30 kg/m2) and sedentary lifestyle. Use of agents associated with hypertension: none. History of target organ damage: none. BP Readings from Last 3 Encounters:  08/21/17 120/70  08/16/16 116/80  03/11/15 (!) 166/108    Headaches She gets headaches tyically untilk her stress has resolved or until she takes an alleve She reports that sometimes its above her eye She usually has normal bp during that time  Past Medical History:  Diagnosis Date  . Anxiety   . Hypertension   . Migraine    ocp induced  . Other and unspecified hyperlipidemia     Current Outpatient Prescriptions  Medication Sig Dispense Refill  . acetaminophen (TYLENOL) 500 MG tablet Take 2 tablets (1,000 mg total) by mouth every 8 (eight) hours as needed for mild pain or moderate pain. Take 2 tabs every 8 hours. 30 tablet 3  . cholecalciferol (VITAMIN D) 1000 UNITS tablet Take 1,000 Units by mouth daily.    Marland Kitchen lisinopril-hydrochlorothiazide (PRINZIDE,ZESTORETIC) 10-12.5 MG tablet Take 1 tablet by mouth daily. 90 tablet 2   No current facility-administered medications for this visit.     Allergies:  Allergies  Allergen Reactions  . Crestor [Rosuvastatin]   . Enalapril Other (See Comments)    Dizziness  . Fish Oil   . Metoprolol Other (See Comments)    Dizziness and hair loss    Past Surgical History:  Procedure Laterality Date  . CESAREAN SECTION  05/2008    Social History   Social History  . Marital status: Single    Spouse  name: N/A  . Number of children: 1  . Years of education: N/A   Occupational History  . [employer]   Social History Main Topics  . Smoking status: Never Smoker  . Smokeless tobacco: Never Used  . Alcohol use No  . Drug use: No  . Sexual activity: Yes   Other Topics Concern  . None   Social History Narrative  . None    ROS Review of Systems See HPI Constitution: No fevers or chills No malaise No diaphoresis Skin: No rash or itching Eyes: no blurry vision, no double vision GU: no dysuria or hematuria Neuro: no dizziness or headaches  Objective: Vitals:   08/21/17 1025 08/21/17 1053  BP: (!) 137/91 120/70  Pulse: 82   Resp: 16   Temp: 98.5 F (36.9 C)   TempSrc: Oral   SpO2: 97%   Weight: 238 lb 6.4 oz (108.1 kg)   Height: 5' 7.5" (1.715 m)     Physical Exam  Constitutional: She is oriented to person, place, and time. She appears well-developed and well-nourished.  HENT:  Head: Normocephalic and atraumatic.  Eyes: Conjunctivae and EOM are normal.  Cardiovascular: Normal rate, regular rhythm and normal heart sounds.   Pulmonary/Chest: Effort normal and breath sounds normal. No respiratory distress. She has no wheezes.  Musculoskeletal: Normal range of motion. She exhibits no edema.  Neurological: She is alert and oriented to person, place, and time.  Skin: Skin is warm. Capillary refill takes less than 2 seconds.  No rash noted.  Psychiatric: She has a normal mood and affect. Her behavior is normal. Judgment and thought content normal.      Assessment and Plan Nathalya was seen today for medication refill.  Diagnoses and all orders for this visit:  Essential hypertension- bp in good range, cpm -     Comprehensive metabolic panel -     Lipid panel  Encounter for medication monitoring- will monitor electrolytes, lipid and thyroid function -     Comprehensive metabolic panel -     Lipid panel -     TSH  Need for prophylactic  vaccination and inoculation against influenza -     Flu Vaccine QUAD 36+ mos IM  Special screening for malignant neoplasms, colon -     Ambulatory referral to Gastroenterology  Encounter for screening mammogram for breast cancer -     MM Digital Screening; Future  Frequent headaches- associated with stress Will keep bp meds the same Discussed deep breathing     Nikola Blackston A Creta Levin

## 2017-08-21 NOTE — Patient Instructions (Addendum)
   IF you received an x-ray today, you will receive an invoice from [facility]. Please contact [facility] at [phone] with questions or concerns regarding your invoice.   IF you received labwork today, you will receive an invoice from [facility]. Please contact [facility] at [phone] with questions or concerns regarding your invoice.   Our billing staff will not be able to assist you with questions regarding bills from these companies.  You will be contacted with the lab results as soon as they are available. The fastest way to get your results is to activate your My Chart account. Instructions are located on the last page of this paperwork. If you have not heard from us regarding the results in 2 weeks, please contact this office.     Cancer Screening for Women A cancer screening is a test or exam that checks for cancer. Your health care provider will recommend specific cancer screenings based on your age, personal history, and family history of cancer. Work with your health care provider to create a cancer screening schedule that protects your health. Why is cancer screening done? Cancer screening is done to look for cancer in the very early stages, before it spreads and becomes harder to treat and before you would start to notice symptoms. Finding cancer early improves the chances of successful treatment. It may save your life. Who should be screened for cancer? All women should be screened for certain cancers, including breast cancer, cervical cancer, and skin cancer. Your health care provider may recommend screenings for other types of cancer if:  You had cancer before.  You have a family member with cancer.  You have abnormal genes that could increase the risk of cancer.  You have risk factors for certain cancers, such as smoking.  When you should be screened for cancer depends on:  Your age.  Your medical history and your family's medical  history.  Certain lifestyle factors, such as smoking.  Environmental exposure, such as to asbestos.  What are some common cancer screenings? Breast cancer Breast cancer screening is done with a test that takes images of breast tissue (mammogram). Here are some screening guidelines:  When you are age 40-44. you will be given the choice to start having mammograms.  When you are age 45-54, you should have a mammogram every year.  You may start having mammograms before age 45 if you have risk factors for breast cancer, such as having an immediate family member with breast cancer.  When you are age 55 or older, you should have a mammogram every 1-2 years for as long as you are in good health and have a life expectancy of 10 years or more.  It is important to know what your breasts look and feel like so you can report any changes to your health care provider.  Cervical cancer Cervical cancer screening is done with a Pap test. This testchecks for abnormalities, including the virus that causes cervical cancer (human papillomavirus, or HPV). To perform the test, a health care provider takes a swab of cervical cells during a pelvic exam. Screening for cervical cancer with a Pap test should start at age 21. Here are some screening guidelines:  When you are age 21-29, you should have a Pap test every 3 years.  When you are age 30-65, you should have a Pap test and HPV test every 5 years or have a Pap test every 3 years.  You may be screened for cervical cancer more   often if you have risk factors for cervical cancer.  If your Pap tests are abnormal, you may have an HPV test.  If you have had the HPV vaccine, you will still be screened for cervical cancer and follow normal screening recommendations.  You do not need to be screened for cervical cancer if any of the following apply to you:  You are older than age 61 and you have not had a serious cervical precancer or cancer in the last 20  years.  Your cervix and uterus have been removed and you have never had cervical cancer or precancerous cells.  Endometrial cancer There is no standard screening test for endometrial cancer, but the cancer can be detected with:  A test of a sample of tissue taken from the lining of the uterus (endometrial tissue biopsy).  A vaginal ultrasound.  Pap tests.  If you are at increased risk for endometrial cancer, you may need to have these tests more often than normal. You are at increased risk if:  You have a family history of ovarian, uterine, or colon cancer.  You are taking tamoxifen, a drug that is used to treat breast cancer.  You have certain types of colon cancer.  If you have reached menopause, it is especially important to talk with your health care provider about any vaginal bleeding or spotting. Screening for endometrial cancer is not recommended for women who do not have symptoms of the cancer, such as vaginal bleeding. Colorectal cancer Screening for colorectal cancer is recommended starting at age 73 for most women. If you have a family history of colon or rectal cancer or other risk factors, you may need to start having screenings earlier. Talk with your health care provider about which screening test is right for you and how often you should be screened. Colorectal cancer screening looks for cancer or for growths called polyps that often form before cancer starts. Tests to look for cancer or polyps include:  Colonoscopy or flexible sigmoidoscopy. For these procedures, a flexible tube with a small camera is inserted into the rectum.  CT colonography. This test uses X-rays and a contrast dye to check the colon for polyps. If a polyp is found, you may need to have a colonoscopy so the polyp can be located and removed.  Tests to look for cancer in the stool (feces) include:  Guaiac-based fecal occult blood test (FOBT). This test detects blood in stool. It can be done at home  with a kit.  Fecal immunochemical test (FIT). This test detects blood in stool. For this test, you will need to collect stool samples at home.  Stool DNA test. This test looks for blood in stool and any changes in DNA that can lead to colon cancer. For this test, you will need to collect a stool sample at home and send it to a lab.  Skin cancer Skin cancer screening is done by checking the skin for unusual moles or spots and any changes in existing moles. Your health care provider should check your skin for signs of skin cancer at every physical exam. You should check your skin every month and tell your health care provider right away if anything looks unusual. Women with a higher-than-normal risk for skin cancer may want to see a skin specialist (dermatologist) for an annual body check. Lung cancer Lung cancer screening is done with a CT scan that looks for abnormal cells in the lungs. Discuss lung cancer screening with your health care provider  if you are 31-29 years old and if any of the following apply to you:  You currently smoke.  You used to smoke heavily.  You have had at least a 30-pack-year smoking history.  You have quit smoking within the past 15 years.  If you smoke heavily or if you used to smoke, you may need to be screened every year. Where to find more information:  [place]: SkinPromotion.no  Centers for Disease Control and Prevention: http://knight-sullivan.biz/  Department of Health and Human Services: BankingDetective.si Contact a health care provider if:  You have concerns about any signs or symptoms of cancer, such as: ? Moles that have an unusual shape or color. ? Changes in existing moles. ? A sore on your skin that does not heal. ? Blood in your stool. ? Fatigue that does not go away. ? Frequent pain or cramping in your  abdomen. ? Coughing, or coughing up blood. ? Losing weight without trying. ? Lumps or other changes in your breasts. ? Vaginal bleeding, spotting, or changes in your periods. Summary  Be aware of and watch for signs and symptoms of cancer, especially symptoms of breast cancer, cervical cancer, endometrial cancer, colorectal cancer, skin cancer, and lung cancer.  Early detection of cancer with cancer screening may save your life.  Talk with your health care provider about your specific cancer risks.  Work together with your health care provider to create a cancer screening plan that is right for you. This information is not intended to replace advice given to you by your health care provider. Make sure you discuss any questions you have with your health care provider. Document Released: 08/16/2016 Document Revised: 08/16/2016 Document Reviewed: 08/16/2016 Elsevier Interactive Patient Education  [employer].

## 2017-08-22 LAB — COMPREHENSIVE METABOLIC PANEL
A/G RATIO: 1.6 (ref 1.2–2.2)
ALT: 8 IU/L (ref 0–32)
AST: 15 IU/L (ref 0–40)
Albumin: 4.4 g/dL (ref 3.5–5.5)
Alkaline Phosphatase: 84 IU/L (ref 39–117)
BUN/Creatinine Ratio: 12 (ref 9–23)
BUN: 11 mg/dL (ref 6–24)
Bilirubin Total: 0.3 mg/dL (ref 0.0–1.2)
CALCIUM: 9.5 mg/dL (ref 8.7–10.2)
CO2: 26 mmol/L (ref 20–29)
CREATININE: 0.89 mg/dL (ref 0.57–1.00)
Chloride: 99 mmol/L (ref 96–106)
GFR, EST AFRICAN AMERICAN: 87 mL/min/{1.73_m2} (ref >59)
GFR, EST NON AFRICAN AMERICAN: 75 mL/min/{1.73_m2} (ref >59)
Globulin, Total: 2.8 g/dL (ref 1.5–4.5)
Glucose: 95 mg/dL (ref 65–99)
POTASSIUM: 4.1 mmol/L (ref 3.5–5.2)
Sodium: 139 mmol/L (ref 134–144)
TOTAL PROTEIN: 7.2 g/dL (ref 6.0–8.5)

## 2017-08-22 LAB — LIPID PANEL
CHOL/HDL RATIO: 4.3 ratio (ref 0.0–4.4)
CHOLESTEROL TOTAL: 264 mg/dL — AB (ref 100–199)
HDL: 61 mg/dL (ref >39)
LDL CALC: 186 mg/dL — AB (ref 0–99)
Triglycerides: 86 mg/dL (ref 0–149)
VLDL CHOLESTEROL CAL: 17 mg/dL (ref 5–40)

## 2017-08-22 LAB — TSH: TSH: 1.08 u[IU]/mL (ref 0.450–4.500)

## 2017-08-23 NOTE — Telephone Encounter (Signed)
Medication has been sent to pharmacy.  °

## 2017-09-23 ENCOUNTER — Encounter: Payer: Self-pay | Admitting: Family Medicine

## 2018-02-14 ENCOUNTER — Other Ambulatory Visit: Payer: Self-pay | Admitting: Family Medicine

## 2018-03-15 ENCOUNTER — Other Ambulatory Visit: Payer: Self-pay | Admitting: Family Medicine

## 2018-03-17 ENCOUNTER — Ambulatory Visit: Payer: Self-pay | Admitting: Family Medicine

## 2018-03-17 DIAGNOSIS — Z0289 Encounter for other administrative examinations: Secondary | ICD-10-CM

## 2018-03-18 DIAGNOSIS — I10 Essential (primary) hypertension: Secondary | ICD-10-CM | POA: Diagnosis not present

## 2018-03-25 DIAGNOSIS — I10 Essential (primary) hypertension: Secondary | ICD-10-CM | POA: Diagnosis not present

## 2019-03-09 ENCOUNTER — Other Ambulatory Visit: Payer: Self-pay | Admitting: Urgent Care

## 2019-03-11 DIAGNOSIS — I10 Essential (primary) hypertension: Secondary | ICD-10-CM | POA: Diagnosis not present

## 2020-11-08 DIAGNOSIS — E063 Autoimmune thyroiditis: Secondary | ICD-10-CM | POA: Insufficient documentation

## 2021-04-17 ENCOUNTER — Encounter: Payer: Self-pay | Admitting: Endocrinology

## 2021-04-17 ENCOUNTER — Ambulatory Visit (INDEPENDENT_AMBULATORY_CARE_PROVIDER_SITE_OTHER): Payer: 59 | Admitting: Endocrinology

## 2021-04-17 ENCOUNTER — Other Ambulatory Visit: Payer: Self-pay

## 2021-04-17 DIAGNOSIS — E0590 Thyrotoxicosis, unspecified without thyrotoxic crisis or storm: Secondary | ICD-10-CM | POA: Diagnosis not present

## 2021-04-17 LAB — TSH: TSH: <0.01 u[IU]/mL — ABNORMAL LOW (ref 0.35–4.50)

## 2021-04-17 LAB — T4, FREE: Free T4: 0.97 ng/dL (ref 0.60–1.60)

## 2021-04-17 MED ORDER — PROPYLTHIOURACIL 50 MG PO TABS: 100.0000 mg | ORAL_TABLET | Freq: Two times a day (BID) | ORAL | 1 refills | Status: DC

## 2021-04-17 NOTE — Patient Instructions (Addendum)
Blood tests are requested for you today.  We'll let you know about the results.  If ever you have fever while taking propylthiouracil, stop it and call us, even if the reason is obvious, because of the risk of a rare side-effect.   It is best to never miss the propylthiouracil.  However, if you do miss it, next best is to double up the next time.   In the future, you can choose 1 of the other treatment options we discussed today.   Please come back for a follow-up appointment in 2 months.       Hyperthyroidism  Hyperthyroidism is when the thyroid gland is too active (overactive). The thyroid gland is a small gland located in the lower front part of the neck, just in front of the windpipe (trachea). This gland makes hormones that help control how the body uses food for energy (metabolism) as well as how the heart and brain function. These hormones also play a role in keeping your bones strong. When the thyroid is overactive, it produces too much of a hormone called thyroxine. What are the causes? This condition may be caused by:  Graves' disease. This is a disorder in which the body's disease-fighting system (immune system) attacks the thyroid gland. This is the most common cause.  Inflammation of the thyroid gland.  A tumor in the thyroid gland.  Use of certain medicines, including: ? Prescription thyroid hormone replacement. ? Herbal supplements that mimic thyroid hormones. ? Amiodarone therapy.  Solid or fluid-filled lumps within your thyroid gland (thyroid nodules).  Taking in a large amount of iodine from foods or medicines. What increases the risk? You are more likely to develop this condition if:  You are female.  You have a family history of thyroid conditions.  You smoke tobacco.  You use a medicine called lithium.  You take medicines that affect the immune system (immunosuppressants). What are the signs or symptoms? Symptoms of this condition  include:  Nervousness.  Inability to tolerate heat.  Unexplained weight loss.  Diarrhea.  Change in the texture of hair or skin.  Heart skipping beats or making extra beats.  Rapid heart rate.  Loss of menstruation.  Shaky hands.  Fatigue.  Restlessness.  Sleep problems.  Enlarged thyroid gland or a lump in the thyroid (nodule). You may also have symptoms of Graves' disease, which may include:  Protruding eyes.  Dry eyes.  Red or swollen eyes.  Problems with vision. How is this diagnosed? This condition may be diagnosed based on:  Your symptoms and medical history.  A physical exam.  Blood tests.  Thyroid ultrasound. This test involves using sound waves to produce images of the thyroid gland.  A thyroid scan. A radioactive substance is injected into a vein, and images show how much iodine is present in the thyroid.  Radioactive iodine uptake test (RAIU). A small amount of radioactive iodine is given by mouth to see how much iodine the thyroid absorbs after a certain amount of time. How is this treated? Treatment depends on the cause and severity of the condition. Treatment may include:  Medicines to reduce the amount of thyroid hormone your body makes.  Radioactive iodine treatment (radioiodine therapy). This involves swallowing a small dose of radioactive iodine, in capsule or liquid form, to kill thyroid cells.  Surgery to remove part or all of your thyroid gland. You may need to take thyroid hormone replacement medicine for the rest of your life after thyroid surgery.  Medicines to help manage your symptoms. Follow these instructions at home:  Take over-the-counter and prescription medicines only as told by your health care provider.  Do not use any products that contain nicotine or tobacco, such as cigarettes and e-cigarettes. If you need help quitting, ask your health care provider.  Follow any instructions from your health care provider about  diet. You may be instructed to limit foods that contain iodine.  Keep all follow-up visits as told by your health care provider. This is important. ? You will need to have blood tests regularly so that your health care provider can monitor your condition.   Contact a health care provider if:  Your symptoms do not get better with treatment.  You have a fever.  You are taking thyroid hormone replacement medicine and you: ? Have symptoms of depression. ? Feel like you are tired all the time. ? Gain weight. Get help right away if:  You have chest pain.  You have decreased alertness or a change in your awareness.  You have abdominal pain.  You feel dizzy.  You have a rapid heartbeat.  You have an irregular heartbeat.  You have difficulty breathing. Summary  The thyroid gland is a small gland located in the lower front part of the neck, just in front of the windpipe (trachea).  Hyperthyroidism is when the thyroid gland is too active (overactive) and produces too much of a hormone called thyroxine.  The most common cause is Graves' disease, a disorder in which your immune system attacks the thyroid gland.  Hyperthyroidism can cause various symptoms, such as unexplained weight loss, nervousness, inability to tolerate heat, or changes in your heartbeat.  Treatment may include medicine to reduce the amount of thyroid hormone your body makes, radioiodine therapy, surgery, or medicines to manage symptoms. This information is not intended to replace advice given to you by your health care provider. Make sure you discuss any questions you have with your health care provider. Document Revised: 08/04/2020 Document Reviewed: 08/04/2020 Elsevier Patient Education  2021 [employer].

## 2021-04-17 NOTE — Progress Notes (Signed)
Subjective:    Patient ID: Alexandria Clark, female    DOB: [birth year], 55 y.o.   MRN: [id number]  HPI Pt is referred by Vick Frees, PA, for hyperthyroidism.  Pt reports he was dx'ed with hyperthyroidism in 12/21.  She was rx'ed tapazole 1/22.  she has never had XRT to the anterior neck, or thyroid surgery.  she has never had thyroid imaging.  she does not consume kelp or any other non-prescribed thyroid medication.  she has never been on amiodarone.  Palpitations and heat intolerance are resolved.   She has regained a few lbs.  Past Medical History:  Diagnosis Date  . Anxiety   . Hypertension   . Migraine    ocp induced  . Other and unspecified hyperlipidemia     Past Surgical History:  Procedure Laterality Date  . CESAREAN SECTION  05/2008    Social History   Socioeconomic History  . Marital status: Single    Spouse name: Not on file  . Number of children: 1  . Years of education: Not on file  . Highest education level: Not on file  Occupational History  . Occupation: Quarry manager: [employer]  Tobacco Use  . Smoking status: Never Smoker  . Smokeless tobacco: Never Used  Substance and Sexual Activity  . Alcohol use: No  . Drug use: No  . Sexual activity: Yes  Other Topics Concern  . Not on file  Social History Narrative  . Not on file   Social Determinants of Health   Financial Resource Strain: Not on file  Food Insecurity: Not on file  Transportation Needs: Not on file  Physical Activity: Not on file  Stress: Not on file  Social Connections: Not on file  Intimate Partner Violence: Not on file    Current Outpatient Medications on File Prior to Visit  Medication Sig Dispense Refill  . acetaminophen (TYLENOL) 500 MG tablet Take 2 tablets (1,000 mg total) by mouth every 8 (eight) hours as needed for mild pain or moderate pain. Take 2 tabs every 8 hours. 30 tablet 3  . cholecalciferol (VITAMIN D) 1000 UNITS tablet Take 1,000 Units by  mouth daily.    Marland Kitchen lisinopril-hydrochlorothiazide (PRINZIDE,ZESTORETIC) 10-12.5 MG tablet TAKE 1 TABLET BY MOUTH EVERY DAY 30 tablet 0   No current facility-administered medications on file prior to visit.    Allergies  Allergen Reactions  . Crestor [Rosuvastatin]   . Enalapril Other (See Comments)    Dizziness  . Fish Oil   . Metoprolol Other (See Comments)    Dizziness and hair loss    Family History  Problem Relation Age of Onset  . Hypertension Mother   . Hyperlipidemia Mother   . Cancer Mother 35       Breast  . Hypothyroidism Mother   . Hypertension Maternal Aunt   . Hyperlipidemia Maternal Aunt   . Cancer Maternal Aunt 55       Breast; Multiple Myeloma  . Heart disease Maternal Grandmother        MI @ 72- survived  . Stroke Maternal Grandmother 66  . Hypertension Maternal Uncle   . Diabetes Maternal Uncle   . Drug abuse Maternal Uncle   . Heart disease Maternal Uncle   . Diabetes Cousin     BP 118/78 (BP Location: Right Arm, Patient Position: Sitting, Cuff Size: Large)   Pulse 84   Ht 5' 7.5" (1.715 m)   Wt 239 lb 6.4 oz (108.6  kg)   SpO2 97%   BMI 36.94 kg/m    Review of Systems denies weight loss, fever, sob, excessive diaphoresis, tremor, and anxiety.      Objective:   Physical Exam VS: see vs page GEN: no distress HEAD: head: no deformity eyes: no periorbital swelling, no proptosis external nose and ears are normal NECK: thyroid is 3 times normal size, R>L, but no palpable nodule.   CHEST WALL: no deformity.  LUNGS: clear to auscultation CV: reg rate and rhythm, no murmur.  MUSCULOSKELETAL: gait is normal and steady EXTEMITIES: no deformity.  no leg edema NEURO:  readily moves all 4's.  sensation is intact to touch on all 4's.  No tremor.  SKIN:  Normal texture and temperature.  No rash or suspicious lesion is visible.  Not diaphoretic NODES:  None palpable at the neck.   PSYCH: alert, well-oriented.  Does not appear anxious nor depressed.     outside test results are reviewed: TSH<0.01 Free T4=2.4  I have reviewed outside records, and summarized:  Pt was noted to have low TSH, and referred here.  She had a rash on tapazole, so it was changed to PTU, 100-TID      Assessment & Plan:  Hyperthyroidism, new to me, uncontrolled.     Patient Instructions   Blood tests are requested for you today.  We'll let you know about the results.  If ever you have fever while taking propylthiouracil, stop it and call us, even if the reason is obvious, because of the risk of a rare side-effect.   It is best to never miss the propylthiouracil.  However, if you do miss it, next best is to double up the next time.   In the future, you can choose 1 of the other treatment options we discussed today.   Please come back for a follow-up appointment in 2 months.       Hyperthyroidism  Hyperthyroidism is when the thyroid gland is too active (overactive). The thyroid gland is a small gland located in the lower front part of the neck, just in front of the windpipe (trachea). This gland makes hormones that help control how the body uses food for energy (metabolism) as well as how the heart and brain function. These hormones also play a role in keeping your bones strong. When the thyroid is overactive, it produces too much of a hormone called thyroxine. What are the causes? This condition may be caused by:  Graves' disease. This is a disorder in which the body's disease-fighting system (immune system) attacks the thyroid gland. This is the most common cause.  Inflammation of the thyroid gland.  A tumor in the thyroid gland.  Use of certain medicines, including: ? Prescription thyroid hormone replacement. ? Herbal supplements that mimic thyroid hormones. ? Amiodarone therapy.  Solid or fluid-filled lumps within your thyroid gland (thyroid nodules).  Taking in a large amount of iodine from foods or medicines. What increases the risk? You are  more likely to develop this condition if:  You are female.  You have a family history of thyroid conditions.  You smoke tobacco.  You use a medicine called lithium.  You take medicines that affect the immune system (immunosuppressants). What are the signs or symptoms? Symptoms of this condition include:  Nervousness.  Inability to tolerate heat.  Unexplained weight loss.  Diarrhea.  Change in the texture of hair or skin.  Heart skipping beats or making extra beats.  Rapid heart rate.  Loss  of menstruation.  Shaky hands.  Fatigue.  Restlessness.  Sleep problems.  Enlarged thyroid gland or a lump in the thyroid (nodule). You may also have symptoms of Graves' disease, which may include:  Protruding eyes.  Dry eyes.  Red or swollen eyes.  Problems with vision. How is this diagnosed? This condition may be diagnosed based on:  Your symptoms and medical history.  A physical exam.  Blood tests.  Thyroid ultrasound. This test involves using sound waves to produce images of the thyroid gland.  A thyroid scan. A radioactive substance is injected into a vein, and images show how much iodine is present in the thyroid.  Radioactive iodine uptake test (RAIU). A small amount of radioactive iodine is given by mouth to see how much iodine the thyroid absorbs after a certain amount of time. How is this treated? Treatment depends on the cause and severity of the condition. Treatment may include:  Medicines to reduce the amount of thyroid hormone your body makes.  Radioactive iodine treatment (radioiodine therapy). This involves swallowing a small dose of radioactive iodine, in capsule or liquid form, to kill thyroid cells.  Surgery to remove part or all of your thyroid gland. You may need to take thyroid hormone replacement medicine for the rest of your life after thyroid surgery.  Medicines to help manage your symptoms. Follow these instructions at home:  Take  over-the-counter and prescription medicines only as told by your health care provider.  Do not use any products that contain nicotine or tobacco, such as cigarettes and e-cigarettes. If you need help quitting, ask your health care provider.  Follow any instructions from your health care provider about diet. You may be instructed to limit foods that contain iodine.  Keep all follow-up visits as told by your health care provider. This is important. ? You will need to have blood tests regularly so that your health care provider can monitor your condition.   Contact a health care provider if:  Your symptoms do not get better with treatment.  You have a fever.  You are taking thyroid hormone replacement medicine and you: ? Have symptoms of depression. ? Feel like you are tired all the time. ? Gain weight. Get help right away if:  You have chest pain.  You have decreased alertness or a change in your awareness.  You have abdominal pain.  You feel dizzy.  You have a rapid heartbeat.  You have an irregular heartbeat.  You have difficulty breathing. Summary  The thyroid gland is a small gland located in the lower front part of the neck, just in front of the windpipe (trachea).  Hyperthyroidism is when the thyroid gland is too active (overactive) and produces too much of a hormone called thyroxine.  The most common cause is Graves' disease, a disorder in which your immune system attacks the thyroid gland.  Hyperthyroidism can cause various symptoms, such as unexplained weight loss, nervousness, inability to tolerate heat, or changes in your heartbeat.  Treatment may include medicine to reduce the amount of thyroid hormone your body makes, radioiodine therapy, surgery, or medicines to manage symptoms. This information is not intended to replace advice given to you by your health care provider. Make sure you discuss any questions you have with your health care provider. Document  Revised: 08/04/2020 Document Reviewed: 08/04/2020 Elsevier Patient Education  2021 [employer].

## 2021-06-21 ENCOUNTER — Ambulatory Visit: Payer: 59 | Admitting: Endocrinology

## 2021-07-27 ENCOUNTER — Other Ambulatory Visit: Payer: Self-pay

## 2021-07-27 ENCOUNTER — Ambulatory Visit (INDEPENDENT_AMBULATORY_CARE_PROVIDER_SITE_OTHER): Payer: 59 | Admitting: Endocrinology

## 2021-07-27 VITALS — BP 140/90 | HR 81 | Ht 67.5 in | Wt 247.2 lb

## 2021-07-27 DIAGNOSIS — E0590 Thyrotoxicosis, unspecified without thyrotoxic crisis or storm: Secondary | ICD-10-CM | POA: Diagnosis not present

## 2021-07-27 LAB — TSH: TSH: 0.47 u[IU]/mL (ref 0.35–5.50)

## 2021-07-27 LAB — T4, FREE: Free T4: 0.74 ng/dL (ref 0.60–1.60)

## 2021-07-27 MED ORDER — PROPYLTHIOURACIL 50 MG PO TABS: 50.0000 mg | ORAL_TABLET | Freq: Two times a day (BID) | ORAL | 1 refills | Status: DC

## 2021-07-27 MED ORDER — ATENOLOL 25 MG PO TABS: 12.5000 mg | ORAL_TABLET | Freq: Every day | ORAL | 3 refills | Status: DC

## 2021-07-27 NOTE — Progress Notes (Signed)
Subjective:    Patient ID: Alexandria Clark, female    DOB: 1965/12/28, 55 y.o.   MRN: [id number]  HPI Pt returns for f/u of hyperthyroidism (dx'ed 12/21; she was rx'ed tapazole 1/22; she has never had thyroid imaging; She had a rash on tapazole, so it was changed to PTU).  pt states she feels well in general.  Specifically, she denies palpitations and tremor.  She takes PTU as rx'ed.  She says she may consider RAI in later 2022.  Pt says she is on atenolol 25/d Past Medical History:  Diagnosis Date   Anxiety    Hypertension    Migraine    ocp induced   Other and unspecified hyperlipidemia     Past Surgical History:  Procedure Laterality Date   CESAREAN SECTION  05/2008    Social History   Socioeconomic History   Marital status: Single    Spouse name: Not on file   Number of children: 1   Years of education: Not on file   Highest education level: Not on file  Occupational History   Occupation: Musician    Employer: [employer]  Tobacco Use   Smoking status: Never   Smokeless tobacco: Never  Substance and Sexual Activity   Alcohol use: No   Drug use: No   Sexual activity: Yes  Other Topics Concern   Not on file  Social History Narrative   Not on file   Social Determinants of Health   Financial Resource Strain: Not on file  Food Insecurity: Not on file  Transportation Needs: Not on file  Physical Activity: Not on file  Stress: Not on file  Social Connections: Not on file  Intimate Partner Violence: Not on file    Current Outpatient Medications on File Prior to Visit  Medication Sig Dispense Refill   acetaminophen (TYLENOL) 500 MG tablet Take 2 tablets (1,000 mg total) by mouth every 8 (eight) hours as needed for mild pain or moderate pain. Take 2 tabs every 8 hours. 30 tablet 3   cholecalciferol (VITAMIN D) 1000 UNITS tablet Take 1,000 Units by mouth daily.     lisinopril-hydrochlorothiazide (PRINZIDE,ZESTORETIC) 10-12.5 MG tablet TAKE 1 TABLET  BY MOUTH EVERY DAY 30 tablet 0   No current facility-administered medications on file prior to visit.    Allergies  Allergen Reactions   Crestor [Rosuvastatin]    Enalapril Other (See Comments)    Dizziness   Fish Oil    Metoprolol Other (See Comments)    Dizziness and hair loss    Family History  Problem Relation Age of Onset   Hypertension Mother    Hyperlipidemia Mother    Cancer Mother 33       Breast   Hypothyroidism Mother    Hypertension Maternal Aunt    Hyperlipidemia Maternal Aunt    Cancer Maternal Aunt 55       Breast; Multiple Myeloma   Heart disease Maternal Grandmother        MI @ 91- survived   Stroke Maternal Grandmother 66   Hypertension Maternal Uncle    Diabetes Maternal Uncle    Drug abuse Maternal Uncle    Heart disease Maternal Uncle    Diabetes Cousin     BP 140/90 (BP Location: Right Arm, Patient Position: Sitting, Cuff Size: Large)   Pulse 81   Ht 5' 7.5" (1.715 m)   Wt 247 lb 3.2 oz (112.1 kg)   SpO2 97%   BMI 38.15 kg/m  Review of Systems Denies fever    Objective:   Physical Exam NECK: thyroid is 2-3 times normal, diffuse.   Lab Results  Component Value Date   TSH 0.47 07/27/2021      Assessment & Plan:  Hyperthyroidism: well-controlled.  Reduce PTU and atenolol.

## 2021-07-27 NOTE — Patient Instructions (Addendum)
Blood tests are requested for you today.  We'll let you know about the results.   If ever you have fever while taking propylthiouracil, stop it and call us, even if the reason is obvious, because of the risk of a rare side-effect.   It is best to never miss the propylthiouracil.  However, if you do miss it, next best is to double up the next time.   In the future, you can choose 1 of the other treatment options we have discussed.   I have sent a prescription to your pharmacy, to half the atenolol.  Please come back for a follow-up appointment in 2-3 months.

## 2021-09-04 ENCOUNTER — Telehealth: Payer: Self-pay | Admitting: Endocrinology

## 2021-09-04 NOTE — Telephone Encounter (Signed)
Pt is in need of a refill of lisinopril-hydrochlorothiazide (PRINZIDE,ZESTORETIC) 10-12.5 MG tablet to [facility] on [address] Pt contact [phone]

## 2021-09-06 ENCOUNTER — Ambulatory Visit (HOSPITAL_COMMUNITY)
Admission: RE | Admit: 2021-09-06 | Discharge: 2021-09-06 | Disposition: A | Discharge status: Home / Self Care | Payer: 59 | Source: Ambulatory Visit | Attending: Medical Oncology | Admitting: Medical Oncology

## 2021-09-06 ENCOUNTER — Encounter (HOSPITAL_COMMUNITY): Payer: Self-pay

## 2021-09-06 ENCOUNTER — Other Ambulatory Visit: Payer: Self-pay

## 2021-09-06 VITALS — BP 139/79 | HR 81 | Temp 97.8°F | Resp 18

## 2021-09-06 DIAGNOSIS — Z760 Encounter for issue of repeat prescription: Secondary | ICD-10-CM | POA: Diagnosis not present

## 2021-09-06 DIAGNOSIS — I10 Essential (primary) hypertension: Secondary | ICD-10-CM | POA: Diagnosis not present

## 2021-09-06 MED ORDER — LISINOPRIL-HYDROCHLOROTHIAZIDE 10-12.5 MG PO TABS: 1.0000 | ORAL_TABLET | Freq: Every day | ORAL | 0 refills | Status: DC

## 2021-09-06 NOTE — ED Provider Notes (Signed)
[facility]    CSN: [id number] Arrival date & time: 09/06/21  1104      History   Chief Complaint Chief Complaint  Patient presents with   Medication Refill    HPI Alexandria Clark is a 55 y.o. female.   HPI  Essential Hypertension: Pt presents to discuss her essential hypertension. She takes atenolol 25 mg (0.5 tablets by mouth daily) along with lisinopril-hctz 10-12.5 mg once daily. She reports that she has been out of his medication for as of today. She tolerates this well and has had blood work monitoring about 3 months ago that was normal per pt. She denies chest pain, SOB, headaches, peripheral edema. She also asks to establish with an in network PCP as hers is out of network.   Past Medical History:  Diagnosis Date   Anxiety    Hypertension    Migraine    ocp induced   Other and unspecified hyperlipidemia     Patient Active Problem List   Diagnosis Date Noted   Hyperthyroidism 04/17/2021   Obesity 03/11/2015   Dyslipidemia 01/21/2015   Palpitations 01/29/2012   Anxiety 01/24/2012   Hypertension    Migraine     Past Surgical History:  Procedure Laterality Date   CESAREAN SECTION  05/2008    OB History   No obstetric history on file.      Home Medications    Prior to Admission medications   Medication Sig Start Date End Date Taking? Authorizing Provider  acetaminophen (TYLENOL) 500 MG tablet Take 2 tablets (1,000 mg total) by mouth every 8 (eight) hours as needed for mild pain or moderate pain. Take 2 tabs every 8 hours. 01/21/15   Tereasa Coop, PA-C  atenolol (TENORMIN) 25 MG tablet Take 0.5 tablets (12.5 mg total) by mouth daily. 07/27/21   Renato Shin, MD  cholecalciferol (VITAMIN D) 1000 UNITS tablet Take 1,000 Units by mouth daily.    [provider]  lisinopril-hydrochlorothiazide (PRINZIDE,ZESTORETIC) 10-12.5 MG tablet TAKE 1 TABLET BY MOUTH EVERY DAY 02/17/18   Forrest Moron, MD  propylthiouracil (PTU) 50 MG tablet  Take 1 tablet (50 mg total) by mouth 2 (two) times daily. 07/27/21   Renato Shin, MD    Family History Family History  Problem Relation Age of Onset   Hypertension Mother    Hyperlipidemia Mother    Cancer Mother 42       Breast   Hypothyroidism Mother    Hypertension Maternal Aunt    Hyperlipidemia Maternal Aunt    Cancer Maternal Aunt 55       Breast; Multiple Myeloma   Heart disease Maternal Grandmother        MI @ 5- survived   Stroke Maternal Grandmother 66   Hypertension Maternal Uncle    Diabetes Maternal Uncle    Drug abuse Maternal Uncle    Heart disease Maternal Uncle    Diabetes Cousin     Social History Social History   Tobacco Use   Smoking status: Never   Smokeless tobacco: Never  Substance Use Topics   Alcohol use: No   Drug use: No     Allergies   Crestor [rosuvastatin], Enalapril, Fish oil, and Metoprolol   Review of Systems Review of Systems  As stated above in HPI Physical Exam Triage Vital Signs ED Triage Vitals  Enc Vitals Group     BP 09/06/21 1205 139/79     Pulse Rate 09/06/21 1204 81     Resp  09/06/21 1204 18     Temp 09/06/21 1204 97.8 F (36.6 C)     Temp Source 09/06/21 1204 Oral     SpO2 09/06/21 1204 96 %     Weight --      Height --      Head Circumference --      Peak Flow --      Pain Score 09/06/21 1207 0     Pain Loc --      Pain Edu? --      Excl. in [place]? --    No data found.  Updated Vital Signs BP 139/79   Pulse 81   Temp 97.8 F (36.6 C) (Oral)   Resp 18   SpO2 96%   Physical Exam Vitals and nursing note reviewed.  Constitutional:      General: She is not in acute distress.    Appearance: Normal appearance. She is not ill-appearing, toxic-appearing or diaphoretic.  HENT:     Head: Normocephalic and atraumatic.  Eyes:     Extraocular Movements: Extraocular movements intact.     Pupils: Pupils are equal, round, and reactive to light.  Neck:     Vascular: No carotid bruit.  Cardiovascular:      Rate and Rhythm: Normal rate and regular rhythm.     Pulses: Normal pulses.     Heart sounds: Normal heart sounds.  Pulmonary:     Effort: Pulmonary effort is normal.     Breath sounds: Normal breath sounds.  Musculoskeletal:     Cervical back: Normal range of motion and neck supple.     Right lower leg: No edema.     Left lower leg: No edema.  Skin:    General: Skin is warm.  Neurological:     Mental Status: She is alert and oriented to person, place, and time.     UC Treatments / Results  Labs (all labs ordered are listed, but only abnormal results are displayed) Labs Reviewed - No data to display  EKG   Radiology No results found.  Procedures Procedures (including critical care time)  Medications Ordered in UC Medications - No data to display  Initial Impression / Assessment and Plan / UC Course  I have reviewed the triage vital signs and the nursing notes.  Pertinent labs & imaging results that were available during my care of the patient were reviewed by me and considered in my medical decision making (see chart for details).     New. Chronic and stable. Refilled her medication and discussed importance of continue to follow up with a PCP- PCP assistance button selected per pt request. Follow up with our office as needed.  Final Clinical Impressions(s) / UC Diagnoses   Final diagnoses:  None   Discharge Instructions   None    ED Prescriptions   None    PDMP not reviewed this encounter.   Hughie Closs, [place] 09/06/21 1238

## 2021-09-06 NOTE — Telephone Encounter (Signed)
Message sent thru MyChart 

## 2021-09-06 NOTE — ED Triage Notes (Signed)
Pt presents for medication refill for blood pressure medication.

## 2021-09-28 ENCOUNTER — Encounter: Payer: Self-pay | Admitting: Physician Assistant

## 2021-09-28 ENCOUNTER — Other Ambulatory Visit: Payer: Self-pay

## 2021-09-28 ENCOUNTER — Ambulatory Visit: Payer: 59 | Attending: Physician Assistant | Admitting: Physician Assistant

## 2021-09-28 VITALS — BP 148/100 | HR 100 | Ht 67.5 in | Wt 249.4 lb

## 2021-09-28 DIAGNOSIS — E0590 Thyrotoxicosis, unspecified without thyrotoxic crisis or storm: Secondary | ICD-10-CM

## 2021-09-28 DIAGNOSIS — Z09 Encounter for follow-up examination after completed treatment for conditions other than malignant neoplasm: Secondary | ICD-10-CM | POA: Diagnosis not present

## 2021-09-28 DIAGNOSIS — I10 Essential (primary) hypertension: Secondary | ICD-10-CM | POA: Diagnosis not present

## 2021-09-28 MED ORDER — LISINOPRIL-HYDROCHLOROTHIAZIDE 20-25 MG PO TABS: 1.0000 | ORAL_TABLET | Freq: Every day | ORAL | 1 refills | Status: DC

## 2021-09-28 MED ORDER — ATENOLOL 25 MG PO TABS: 12.5000 mg | ORAL_TABLET | Freq: Every day | ORAL | 1 refills | Status: DC

## 2021-09-28 NOTE — Patient Instructions (Signed)
Check blood pressure daily and record and bring readings to next visit

## 2021-09-28 NOTE — Progress Notes (Signed)
Patient ID: Alexandria Clark, female   DOB: 1966-02-23, 55 y.o.   MRN: [id number]   Alexandria Clark, is a 55 y.o. female  YQM:[id number]  [id number]  DOB - [birth year]  Chief Complaint  Patient presents with   Medication Refill       Subjective:   Alexandria Clark is a 55 y.o. female here today for a follow up visit and to establish care after being seen in the ED for uncontrolled htn and out of meds.  She is followed by endocrine for Grave's dz and she is on atenolol for rate control and lisinopril/HCT 10/12.5 daily and BPs not controlled.  However when she takes 2 of the lisinopril/HCT. Patient does have a cuff at home.  Tested for diabetes in march and was negative.  Seen at ED 09/06/2021  Patient has No headache, No chest pain, No abdominal pain - No Nausea, No new weakness tingling or numbness, No Cough - SOB.    No problems updated.  ALLERGIES: Allergies  Allergen Reactions   Crestor [Rosuvastatin]    Enalapril Other (See Comments)    Dizziness   Fish Oil    Metoprolol Other (See Comments)    Dizziness and hair loss    PAST MEDICAL HISTORY: Past Medical History:  Diagnosis Date   Anxiety    Hypertension    Migraine    ocp induced   Other and unspecified hyperlipidemia     MEDICATIONS AT HOME: Prior to Admission medications   Medication Sig Start Date End Date Taking? Authorizing Provider  acetaminophen (TYLENOL) 500 MG tablet Take 2 tablets (1,000 mg total) by mouth every 8 (eight) hours as needed for mild pain or moderate pain. Take 2 tabs every 8 hours. 01/21/15  Yes Ofilia Neas, PA-C  cholecalciferol (VITAMIN D) 1000 UNITS tablet Take 1,000 Units by mouth daily.   Yes [provider]  lisinopril-hydrochlorothiazide (ZESTORETIC) 20-25 MG tablet Take 1 tablet by mouth daily. 09/28/21  Yes Anders Simmonds, PA-C  propylthiouracil (PTU) 50 MG tablet Take 1 tablet (50 mg total) by mouth 2 (two) times daily. 07/27/21  Yes Romero Belling, MD  atenolol  (TENORMIN) 25 MG tablet Take 0.5 tablets (12.5 mg total) by mouth daily. 09/28/21   Sulayman Manning, Marzella Schlein, PA-C    ROS: Neg HEENT Neg resp Neg cardiac Neg GI Neg GU Neg MS Neg psych Neg neuro  Objective:   Vitals:   09/28/21 1124  BP: (!) 148/100  Pulse: 100  SpO2: 98%  Weight: 249 lb 6 oz (113.1 kg)  Height: 5' 7.5" (1.715 m)   Exam General appearance : Awake, alert, not in any distress. Speech Clear. Not toxic looking HEENT: Atraumatic and Normocephalic Neck: Supple, no JVD. No cervical lymphadenopathy.  Chest: Good air entry bilaterally, CTAB.  No rales/rhonchi/wheezing CVS: S1 S2 regular, no murmurs.  Extremities: B/L Lower Ext shows no edema, both legs are warm to touch Neurology: Awake alert, and oriented X 3, CN II-XII intact, Non focal Skin: No Rash  Data Review Lab Results  Component Value Date   HGBA1C 5.4 01/21/2015    Assessment & Plan   1. Hypertension, unspecified type Uncontrolled.  Increase dose lisinopril/hct and check BP daily OOO and record and bring log to next visit - lisinopril-hydrochlorothiazide (ZESTORETIC) 20-25 MG tablet; Take 1 tablet by mouth daily.  Dispense: 90 tablet; Refill: 1 - atenolol (TENORMIN) 25 MG tablet; Take 0.5 tablets (12.5 mg total) by mouth daily.  Dispense: 45 tablet; Refill: 1 - Basic metabolic  panel  2. Encounter for examination following treatment at hospital   3. Hyperthyroidism Foll0wed by endocrine - atenolol (TENORMIN) 25 MG tablet; Take 0.5 tablets (12.5 mg total) by mouth daily.  Dispense: 45 tablet; Refill: 1 - Basic metabolic panel    Patient have been counseled extensively about nutrition and exercise. Other issues discussed during this visit include: low cholesterol diet, weight control and daily exercise, foot care, annual eye examinations at Ophthalmology, importance of adherence with medications and regular follow-up. We also discussed long term complications of uncontrolled diabetes and hypertension.    Return for 3-4 weeks with Franky Macho for BP check; 3 months assign PCP.  The patient was given clear instructions to go to ER or return to medical center if symptoms don't improve, worsen or new problems develop. The patient verbalized understanding. The patient was told to call to get lab results if they haven't heard anything in the next week.      Georgian Co, PA-C [facility] [place], [place] [phone]   09/28/2021, 12:12 PM

## 2021-09-29 LAB — BASIC METABOLIC PANEL
BUN/Creatinine Ratio: 16 (ref 9–23)
BUN: 14 mg/dL (ref 6–24)
CO2: 24 mmol/L (ref 20–29)
Calcium: 9.9 mg/dL (ref 8.7–10.2)
Chloride: 101 mmol/L (ref 96–106)
Creatinine, Ser: 0.85 mg/dL (ref 0.57–1.00)
Glucose: 106 mg/dL — ABNORMAL HIGH (ref 70–99)
Potassium: 4.4 mmol/L (ref 3.5–5.2)
Sodium: 143 mmol/L (ref 134–144)
eGFR: 81 mL/min/{1.73_m2} (ref >59)

## 2021-11-01 ENCOUNTER — Ambulatory Visit: Payer: 59 | Admitting: Endocrinology

## 2021-11-02 ENCOUNTER — Ambulatory Visit: Payer: 59 | Admitting: Pharmacist

## 2021-11-02 ENCOUNTER — Telehealth: Payer: Self-pay

## 2021-11-02 NOTE — Telephone Encounter (Signed)
Copied from CRM [id number]. Topic: General - Other >> Oct 31, 2021  8:36 AM Jaquita Rector A wrote: Reason for CRM: Patient need a call back to reschedule appointment with [facility] please   Called pt back to reschedule appt with [facility], no answer and unable to leave message to VM box being full

## 2022-02-05 ENCOUNTER — Other Ambulatory Visit: Payer: Self-pay

## 2022-02-05 ENCOUNTER — Ambulatory Visit (INDEPENDENT_AMBULATORY_CARE_PROVIDER_SITE_OTHER): Payer: 59 | Admitting: Endocrinology

## 2022-02-05 ENCOUNTER — Encounter: Payer: Self-pay | Admitting: Endocrinology

## 2022-02-05 VITALS — BP 132/96 | HR 86 | Ht 67.5 in | Wt 250.6 lb

## 2022-02-05 DIAGNOSIS — E0590 Thyrotoxicosis, unspecified without thyrotoxic crisis or storm: Secondary | ICD-10-CM

## 2022-02-05 LAB — T4, FREE: Free T4: 0.83 ng/dL (ref 0.60–1.60)

## 2022-02-05 LAB — TSH: TSH: 1.1 u[IU]/mL (ref 0.35–5.50)

## 2022-02-05 NOTE — Progress Notes (Signed)
? ?Subjective:  ? ? Patient ID: Alexandria Clark, female    DOB: March 08, 1966, 56 y.o.   MRN: [id number] ? ?HPI ?Pt returns for f/u of hyperthyroidism (dx'ed 12/21; she was rx'ed tapazole 1/22; she has never had thyroid imaging; She had a rash on tapazole, so it was changed to PTU).  pt states she feels well in general.  Specifically, she denies palpitations and tremor.  She takes PTU 50-BID, as rx'ed.   ?Past Medical History:  ?Diagnosis Date  ? Anxiety   ? Hypertension   ? Migraine   ? ocp induced  ? Other and unspecified hyperlipidemia   ? ? ?Past Surgical History:  ?Procedure Laterality Date  ? CESAREAN SECTION  05/2008  ? ? ?Social History  ? ?Socioeconomic History  ? Marital status: Single  ?  Spouse name: Not on file  ? Number of children: 1  ? Years of education: Not on file  ? Highest education level: Not on file  ?Occupational History  ? Occupation: Musician  ?  Employer: [employer]  ?Tobacco Use  ? Smoking status: Never  ? Smokeless tobacco: Never  ?Substance and Sexual Activity  ? Alcohol use: No  ? Drug use: No  ? Sexual activity: Yes  ?Other Topics Concern  ? Not on file  ?Social History Narrative  ? Not on file  ? ?Social Determinants of Health  ? ?Financial Resource Strain: Not on file  ?Food Insecurity: Not on file  ?Transportation Needs: Not on file  ?Physical Activity: Not on file  ?Stress: Not on file  ?Social Connections: Not on file  ?Intimate Partner Violence: Not on file  ? ? ?Current Outpatient Medications on File Prior to Visit  ?Medication Sig Dispense Refill  ? acetaminophen (TYLENOL) 500 MG tablet Take 2 tablets (1,000 mg total) by mouth every 8 (eight) hours as needed for mild pain or moderate pain. Take 2 tabs every 8 hours. 30 tablet 3  ? atenolol (TENORMIN) 25 MG tablet Take 0.5 tablets (12.5 mg total) by mouth daily. 45 tablet 1  ? cholecalciferol (VITAMIN D) 1000 UNITS tablet Take 1,000 Units by mouth daily.    ? lisinopril-hydrochlorothiazide (ZESTORETIC) 20-25 MG  tablet Take 1 tablet by mouth daily. 90 tablet 1  ? propylthiouracil (PTU) 50 MG tablet Take 1 tablet (50 mg total) by mouth 2 (two) times daily. 180 tablet 1  ? ?No current facility-administered medications on file prior to visit.  ? ? ?Allergies  ?Allergen Reactions  ? Crestor [Rosuvastatin]   ? Enalapril Other (See Comments)  ?  Dizziness  ? Fish Oil   ? Metoprolol Other (See Comments)  ?  Dizziness and hair loss  ? ? ?Family History  ?Problem Relation Age of Onset  ? Hypertension Mother   ? Hyperlipidemia Mother   ? Cancer Mother 36  ?     Breast  ? Hypothyroidism Mother   ? Hypertension Maternal Aunt   ? Hyperlipidemia Maternal Aunt   ? Cancer Maternal Aunt 33  ?     Breast; Multiple Myeloma  ? Heart disease Maternal Grandmother   ?     MI @ 84- survived  ? Stroke Maternal Grandmother 53  ? Hypertension Maternal Uncle   ? Diabetes Maternal Uncle   ? Drug abuse Maternal Uncle   ? Heart disease Maternal Uncle   ? Diabetes Cousin   ? ? ?BP (!) 132/96 (BP Location: Left Arm, Patient Position: Sitting, Cuff Size: Normal)   Pulse 86  Ht 5' 7.5" (1.715 m)   Wt 250 lb 9.6 oz (113.7 kg)   SpO2 97%   BMI 38.67 kg/m?  ? ? ?Review of Systems ?Denies fever ?   ?Objective:  ? Physical Exam ?VITAL SIGNS:  See vs page ?GENERAL: no distress ?NECK: thyroid is 3 times normal, with irreg surface, but no palpable nodule.   ? ? ?Lab Results  ?Component Value Date  ? TSH 1.10 02/05/2022  ? ?   ?Assessment & Plan:  ?Hyperthyroidism: well-controlled.  Please continue the same PTU.  ? ?

## 2022-02-05 NOTE — Patient Instructions (Addendum)
Blood tests are requested for you today.  We'll let you know about the results.   ?If ever you have fever while taking propylthiouracil, stop it and call us, even if the reason is obvious, because of the risk of a rare side-effect.   ?It is best to never miss the propylthiouracil.  However, if you do miss it, next best is to double up the next time.   ?Please come back for a follow-up appointment in 6 months.   ?

## 2022-02-06 ENCOUNTER — Other Ambulatory Visit: Payer: Self-pay | Admitting: Endocrinology

## 2022-03-07 ENCOUNTER — Other Ambulatory Visit: Payer: Self-pay | Admitting: Endocrinology

## 2022-03-07 DIAGNOSIS — E0590 Thyrotoxicosis, unspecified without thyrotoxic crisis or storm: Secondary | ICD-10-CM

## 2022-03-07 DIAGNOSIS — I10 Essential (primary) hypertension: Secondary | ICD-10-CM

## 2022-03-08 ENCOUNTER — Other Ambulatory Visit: Payer: Self-pay | Admitting: Physician Assistant

## 2022-03-08 DIAGNOSIS — I10 Essential (primary) hypertension: Secondary | ICD-10-CM

## 2022-03-08 NOTE — Telephone Encounter (Signed)
Requested Prescriptions  ?Pending Prescriptions Disp Refills  ?? lisinopril-hydrochlorothiazide (ZESTORETIC) 20-25 MG tablet [Pharmacy Med Name: LISINOPRIL-HCTZ 20/25MG TABLETS] 90 tablet 0  ?  Sig: TAKE 1 TABLET BY MOUTH DAILY  ?  ? Cardiovascular:  ACEI + Diuretic Combos Failed - 03/08/2022  9:47 AM  ?  ?  Failed - Last BP in normal range  ?  BP Readings from Last 1 Encounters:  ?02/05/22 (!) 132/96  ?   ?  ?  Passed - Na in normal range and within 180 days  ?  Sodium  ?Date Value Ref Range Status  ?09/28/2021 143 134 - 144 mmol/L Final  ?   ?  ?  Passed - K in normal range and within 180 days  ?  Potassium  ?Date Value Ref Range Status  ?09/28/2021 4.4 3.5 - 5.2 mmol/L Final  ?   ?  ?  Passed - Cr in normal range and within 180 days  ?  Creat  ?Date Value Ref Range Status  ?08/16/2016 0.82 0.50 - 1.05 mg/dL Final  ?  Comment:  ?    ?For patients > or = 56 years of age: The upper reference limit for ?Creatinine is approximately 13% higher for people identified as ?African-American. ?  ?  ? ?Creatinine, Ser  ?Date Value Ref Range Status  ?09/28/2021 0.85 0.57 - 1.00 mg/dL Final  ?   ?  ?  Passed - eGFR is 30 or above and within 180 days  ?  GFR, Est African American  ?Date Value Ref Range Status  ?12/22/2013 >89 mL/min Final  ? ?GFR calc Af Wyvonnia Lora  ?Date Value Ref Range Status  ?08/21/2017 87 >59 mL/min/1.73 Final  ? ?GFR, Est Non African American  ?Date Value Ref Range Status  ?12/22/2013 87 mL/min Final  ?  Comment:  ?    ?The estimated GFR is a calculation valid for adults (>=37 years old) ?that uses the CKD-EPI algorithm to adjust for age and sex. It is   ?not to be used for children, pregnant women, hospitalized patients,    ?patients on dialysis, or with rapidly changing kidney function. ?According to the NKDEP, eGFR >89 is normal, 60-89 shows mild ?impairment, 30-59 shows moderate impairment, 15-29 shows severe ?impairment and <15 is ESRD. ?   ? ?GFR calc non Af Amer  ?Date Value Ref Range Status  ?08/21/2017 75  >59 mL/min/1.73 Final  ? ?eGFR  ?Date Value Ref Range Status  ?09/28/2021 81 >59 mL/min/1.73 Final  ?   ?  ?  Passed - Patient is not pregnant  ?  ?  Passed - Valid encounter within last 6 months  ?  Recent Outpatient Visits   ?      ? 5 months ago Hyperthyroidism  ? [facility], [place], [place]  ? 4 years ago Essential hypertension  ? Primary Care at [facility], Arlie Solomons, MD  ? 5 years ago Essential hypertension  ? Primary Care at [facility], Kayleen Memos, MD  ? 6 years ago Essential hypertension  ? Primary Care at [facility], Gay Filler, MD  ? 7 years ago Essential hypertension  ? Primary Care at [facility], Lavone Neri, MD  ?  ?  ? ?  ?  ?  ? ? ?

## 2022-06-02 ENCOUNTER — Other Ambulatory Visit: Payer: Self-pay | Admitting: Physician Assistant

## 2022-06-02 DIAGNOSIS — I10 Essential (primary) hypertension: Secondary | ICD-10-CM

## 2022-06-04 ENCOUNTER — Telehealth: Payer: Self-pay | Admitting: *Deleted

## 2022-06-04 ENCOUNTER — Encounter: Payer: Self-pay | Admitting: *Deleted

## 2022-06-04 NOTE — Telephone Encounter (Signed)
Requested Prescriptions  Pending Prescriptions Disp Refills  . lisinopril-hydrochlorothiazide (ZESTORETIC) 20-25 MG tablet [Pharmacy Med Name: LISINOPRIL-HCTZ 20/25MG TABLETS] 90 tablet 0    Sig: TAKE 1 TABLET BY MOUTH DAILY     Cardiovascular:  ACEI + Diuretic Combos Failed - 06/02/2022 10:14 AM      Failed - Na in normal range and within 180 days    Sodium  Date Value Ref Range Status  09/28/2021 143 134 - 144 mmol/L Final         Failed - K in normal range and within 180 days    Potassium  Date Value Ref Range Status  09/28/2021 4.4 3.5 - 5.2 mmol/L Final         Failed - Cr in normal range and within 180 days    Creat  Date Value Ref Range Status  08/16/2016 0.82 0.50 - 1.05 mg/dL Final    Comment:      For patients > or = 56 years of age: The upper reference limit for Creatinine is approximately 13% higher for people identified as African-American.      Creatinine, Ser  Date Value Ref Range Status  09/28/2021 0.85 0.57 - 1.00 mg/dL Final         Failed - eGFR is 30 or above and within 180 days    GFR, Est African American  Date Value Ref Range Status  12/22/2013 >89 mL/min Final   GFR calc Af Amer  Date Value Ref Range Status  08/21/2017 87 >59 mL/min/1.73 Final   GFR, Est Non African American  Date Value Ref Range Status  12/22/2013 87 mL/min Final    Comment:      The estimated GFR is a calculation valid for adults (>=49 years old) that uses the CKD-EPI algorithm to adjust for age and sex. It is   not to be used for children, pregnant women, hospitalized patients,    patients on dialysis, or with rapidly changing kidney function. According to the NKDEP, eGFR >89 is normal, 60-89 shows mild impairment, 30-59 shows moderate impairment, 15-29 shows severe impairment and <15 is ESRD.     GFR calc non Af Amer  Date Value Ref Range Status  08/21/2017 75 >59 mL/min/1.73 Final   eGFR  Date Value Ref Range Status  09/28/2021 81 >59 mL/min/1.73 Final          Failed - Last BP in normal range    BP Readings from Last 1 Encounters:  02/05/22 (!) 132/96         Failed - Valid encounter within last 6 months    Recent Outpatient Visits          8 months ago Hyperthyroidism   [place], [place], [place]   4 years ago Essential hypertension   Primary Care at [facility], Arlie Solomons, MD   5 years ago Essential hypertension   Primary Care at [facility], Kayleen Memos, MD   7 years ago Essential hypertension   Primary Care at [facility], Gay Filler, MD   7 years ago Essential hypertension   Primary Care at [facility], Lavone Neri, MD      Future Appointments            In 3 months Gildardo Pounds, NP [place] - Patient is not pregnant

## 2022-06-04 NOTE — Telephone Encounter (Signed)
Calling to schedule appt with a provider that can be her PCP. She has only seen [facility] in the past. First new pt appt is in October. We scheduled it. Will give one month refill and have her schedule with [facility] since new pt appt is so far away.

## 2022-07-18 ENCOUNTER — Ambulatory Visit: Payer: 59 | Admitting: Critical Care Medicine

## 2022-08-08 NOTE — Progress Notes (Signed)
Name: Alexandria Clark  MRN/ DOB: [id number], May 04, 1966    Age/ Sex: 56 y.o., female     PCP: Pcp, No   Reason for Endocrinology Evaluation: Hyperthyroidism     Initial Endocrinology Clinic Visit: 04/17/2021    PATIENT IDENTIFIER: Alexandria Clark is a 56 y.o., female with a past medical history of Hyperthyrodism. She has followed with [facility] since 04/17/2021 for consultative assistance with management of her hyperthyroidism.   HISTORICAL SUMMARY: The patient was first diagnosed with hyperthyroidism in 11/2020. Pt was started on methimazole in 12/2020 but developed a rash and was switched to PTU   Patient has followed up with Dr. Loanne Drilling from 2022 until March 2023   Mother, maternal aunt and cousins with thyroid disease  SUBJECTIVE:    Today (08/09/2022):  Alexandria Clark is here for a follow up on hyperthyroidism .     Weight fluctuates  Denies palpitations  Denies tremors No diarrhea or constipation  No recent fever  Denies local neck swelling  NO Biotin     PTU 50 mg BID    HISTORY:  Past Medical History:  Past Medical History:  Diagnosis Date   Anxiety    Hypertension    Migraine    ocp induced   Other and unspecified hyperlipidemia    Past Surgical History:  Past Surgical History:  Procedure Laterality Date   CESAREAN SECTION  05/2008   Social History:  reports that she has never smoked. She has never used smokeless tobacco. She reports that she does not drink alcohol and does not use drugs. Family History:  Family History  Problem Relation Age of Onset   Hypertension Mother    Hyperlipidemia Mother    Cancer Mother 61       Breast   Hypothyroidism Mother    Hypertension Maternal Aunt    Hyperlipidemia Maternal Aunt    Cancer Maternal Aunt 55       Breast; Multiple Myeloma   Heart disease Maternal Grandmother        MI @ 37- survived   Stroke Maternal Grandmother 66   Hypertension Maternal Uncle    Diabetes Maternal  Uncle    Drug abuse Maternal Uncle    Heart disease Maternal Uncle    Diabetes Cousin      HOME MEDICATIONS: Allergies as of 08/09/2022       Reactions   Crestor [rosuvastatin]    Enalapril Other (See Comments)   Dizziness   Fish Oil    Metoprolol Other (See Comments)   Dizziness and hair loss        Medication List        Accurate as of August 09, 2022 11:52 AM. If you have any questions, ask your nurse or doctor.          acetaminophen 500 MG tablet Commonly known as: TYLENOL Take 2 tablets (1,000 mg total) by mouth every 8 (eight) hours as needed for mild pain or moderate pain. Take 2 tabs every 8 hours.   atenolol 25 MG tablet Commonly known as: TENORMIN Take 0.5 tablets (12.5 mg total) by mouth daily.   cholecalciferol 1000 units tablet Commonly known as: VITAMIN D Take 1,000 Units by mouth daily.   lisinopril-hydrochlorothiazide 20-25 MG tablet Commonly known as: ZESTORETIC TAKE 1 TABLET BY MOUTH DAILY   propylthiouracil 50 MG tablet Commonly known as: PTU TAKE 1 TABLET(50 MG) BY MOUTH TWICE DAILY          OBJECTIVE:  PHYSICAL EXAM: VS: BP 136/88 (BP Location: Left Arm, Patient Position: Sitting, Cuff Size: Large)   Pulse 82   Ht 5' 7.5" (1.715 m)   Wt 256 lb (116.1 kg)   SpO2 98%   BMI 39.50 kg/m    EXAM: General: Pt appears well and is in NAD  Eyes: External eye exam normal without stare, lid lag or exophthalmos.  EOM intact.    Neck: General: Supple without adenopathy. Thyroid: right thyroid enlargement noted . No thyroid bruit.  Lungs: Clear with good BS bilat with no rales, rhonchi, or wheezes  Heart: Auscultation: RRR.  Abdomen: Normoactive bowel sounds, soft, nontender, without masses or organomegaly palpable  Extremities:  BL LE: No pretibial edema normal ROM and strength.  Mental Status: Judgment, insight: Intact Orientation: Oriented to time, place, and person Mood and affect: No depression, anxiety, or agitation      DATA REVIEWED:  Latest Reference Range & Units 08/09/22 12:13  Sodium 135 - 145 mEq/L 138  Potassium 3.5 - 5.1 mEq/L 3.8  Chloride 96 - 112 mEq/L 99  CO2 19 - 32 mEq/L 29  Glucose 70 - 99 mg/dL 106 (H)  BUN 6 - 23 mg/dL 14  Creatinine 0.40 - 1.20 mg/dL 0.88  Calcium 8.4 - 10.5 mg/dL 9.8  Alkaline Phosphatase 39 - 117 U/L 71  Albumin 3.5 - 5.2 g/dL 4.2  AST 0 - 37 U/L 15  ALT 0 - 35 U/L 11  Total Protein 6.0 - 8.3 g/dL 8.0  Total Bilirubin 0.2 - 1.2 mg/dL 0.4  GFR >60.00 mL/min 73.39    Latest Reference Range & Units 08/09/22 12:13  WBC 4.0 - 10.5 K/uL 5.2  RBC 3.87 - 5.11 Mil/uL 5.87 (H)  Hemoglobin 12.0 - 15.0 g/dL 13.6  HCT 36.0 - 46.0 % 43.0  MCV 78.0 - 100.0 fl 73.4 (L)  MCHC 30.0 - 36.0 g/dL 31.6  RDW 11.5 - 15.5 % 14.8  Platelets 150.0 - 400.0 K/uL 215.0  Glucose 70 - 99 mg/dL 106 (H)  TSH 0.35 - 5.50 uIU/mL 2.59  Triiodothyronine (T3) 76 - 181 ng/dL 149  T4,Free(Direct) 0.60 - 1.60 ng/dL 0.94      Old records , labs and images have been reviewed.   ASSESSMENT / PLAN / RECOMMENDATIONS:   Hyperthyroidism:   -Patient is clinically euthyroid -We discussed differential diagnosis of hyperthyroidism to include Graves' disease versus toxic thyroid nodule -Given asymmetry of the right thyroid lobe, we will proceed with thyroid ultrasound -She developed a rash to methimazole, tolerating PTU without any side effects -We did discuss long-term alternative treatment options to include continuation of thionamides therapy, versus radioactive iodine ablation, versus  thyroidectomy  -I carefully explained to the patient that one of the consequences of I-131 ablation treatment would likely be permanent hypothyroidism which would require long-term replacement therapy with LT4. -TFTs normal, no change to PTU dose -We will stop atenolol   Medications   Continue PTU 50 mg twice daily Stop atenolol   Follow-up in 6 months   Signed electronically by: Mack Guise, MD  [facility]  [facility] [address] [place], [place] [address] Phone: [phone] FAX: [phone]      CC: Pcp, No No address on file Phone: None  Fax: None   Return to Endocrinology clinic as below: Future Appointments  Date Time Provider [place]  09/17/2022  8:50 AM Gildardo Pounds, NP [id number] None

## 2022-08-09 ENCOUNTER — Encounter: Payer: Self-pay | Admitting: Internal Medicine

## 2022-08-09 ENCOUNTER — Ambulatory Visit (INDEPENDENT_AMBULATORY_CARE_PROVIDER_SITE_OTHER): Payer: 59 | Admitting: Internal Medicine

## 2022-08-09 VITALS — BP 136/88 | HR 82 | Ht 67.5 in | Wt 256.0 lb

## 2022-08-09 DIAGNOSIS — E0590 Thyrotoxicosis, unspecified without thyrotoxic crisis or storm: Secondary | ICD-10-CM

## 2022-08-09 DIAGNOSIS — E010 Iodine-deficiency related diffuse (endemic) goiter: Secondary | ICD-10-CM | POA: Diagnosis not present

## 2022-08-09 LAB — CBC
HCT: 43 % (ref 36.0–46.0)
Hemoglobin: 13.6 g/dL (ref 12.0–15.0)
MCHC: 31.6 g/dL (ref 30.0–36.0)
MCV: 73.4 fl — ABNORMAL LOW (ref 78.0–100.0)
Platelets: 215 10*3/uL (ref 150.0–400.0)
RBC: 5.87 Mil/uL — ABNORMAL HIGH (ref 3.87–5.11)
RDW: 14.8 % (ref 11.5–15.5)
WBC: 5.2 10*3/uL (ref 4.0–10.5)

## 2022-08-09 LAB — COMPREHENSIVE METABOLIC PANEL
ALT: 11 U/L (ref 0–35)
AST: 15 U/L (ref 0–37)
Albumin: 4.2 g/dL (ref 3.5–5.2)
Alkaline Phosphatase: 71 U/L (ref 39–117)
BUN: 14 mg/dL (ref 6–23)
CO2: 29 mEq/L (ref 19–32)
Calcium: 9.8 mg/dL (ref 8.4–10.5)
Chloride: 99 mEq/L (ref 96–112)
Creatinine, Ser: 0.88 mg/dL (ref 0.40–1.20)
GFR: 73.39 mL/min (ref >60.00)
Glucose, Bld: 106 mg/dL — ABNORMAL HIGH (ref 70–99)
Potassium: 3.8 mEq/L (ref 3.5–5.1)
Sodium: 138 mEq/L (ref 135–145)
Total Bilirubin: 0.4 mg/dL (ref 0.2–1.2)
Total Protein: 8 g/dL (ref 6.0–8.3)

## 2022-08-09 LAB — T4, FREE: Free T4: 0.94 ng/dL (ref 0.60–1.60)

## 2022-08-09 LAB — TSH: TSH: 2.59 u[IU]/mL (ref 0.35–5.50)

## 2022-08-09 MED ORDER — PROPYLTHIOURACIL 50 MG PO TABS: 50.0000 mg | ORAL_TABLET | Freq: Two times a day (BID) | ORAL | 2 refills | Status: DC

## 2022-08-10 LAB — T3: T3, Total: 149 ng/dL (ref 76–181)

## 2022-08-10 MED ORDER — PROPYLTHIOURACIL 50 MG PO TABS: 50.0000 mg | ORAL_TABLET | Freq: Two times a day (BID) | ORAL | 3 refills | Status: DC

## 2022-08-20 ENCOUNTER — Ambulatory Visit
Admission: RE | Admit: 2022-08-20 | Discharge: 2022-08-20 | Disposition: A | Discharge status: Home / Self Care | Payer: 59 | Source: Ambulatory Visit | Attending: Internal Medicine | Admitting: Internal Medicine

## 2022-08-20 DIAGNOSIS — E0590 Thyrotoxicosis, unspecified without thyrotoxic crisis or storm: Secondary | ICD-10-CM

## 2022-08-30 ENCOUNTER — Other Ambulatory Visit: Payer: Self-pay | Admitting: Nurse Practitioner

## 2022-08-30 DIAGNOSIS — I10 Essential (primary) hypertension: Secondary | ICD-10-CM

## 2022-08-30 NOTE — Telephone Encounter (Signed)
Requested Prescriptions  Pending Prescriptions Disp Refills  . lisinopril-hydrochlorothiazide (ZESTORETIC) 20-25 MG tablet [Pharmacy Med Name: LISINOPRIL-HCTZ 20/25MG TABLETS] 19 tablet 0    Sig: TAKE 1 TABLET BY MOUTH DAILY     Cardiovascular:  ACEI + Diuretic Combos Failed - 08/30/2022  1:18 PM      Failed - Valid encounter within last 6 months    Recent Outpatient Visits          11 months ago Hyperthyroidism   [place], [place]   5 years ago Essential hypertension   Primary Care at [facility], [place], [place]   6 years ago Essential hypertension   Primary Care at [facility], Kayleen Memos, MD   7 years ago Essential hypertension   [facility], Gay Filler, MD   7 years ago Essential hypertension   Primary Care at [facility], Lavone Neri, MD      Future Appointments            In 2 weeks Gildardo Pounds, NP [place] - Na in normal range and within 180 days    Sodium  Date Value Ref Range Status  08/09/2022 138 135 - 145 mEq/L Final  09/28/2021 143 134 - 144 mmol/L Final         Passed - K in normal range and within 180 days    Potassium  Date Value Ref Range Status  08/09/2022 3.8 3.5 - 5.1 mEq/L Final         Passed - Cr in normal range and within 180 days    Creat  Date Value Ref Range Status  08/16/2016 0.82 0.50 - 1.05 mg/dL Final    Comment:      For patients > or = 56 years of age: The upper reference limit for Creatinine is approximately 13% higher for people identified as African-American.      Creatinine, Ser  Date Value Ref Range Status  08/09/2022 0.88 0.40 - 1.20 mg/dL Final         Passed - eGFR is 30 or above and within 180 days    GFR, Est African American  Date Value Ref Range Status  12/22/2013 >89 mL/min Final   GFR calc Af Amer  Date Value Ref Range Status  08/21/2017 87 >59 mL/min/1.73 Final   GFR, Est Non  African American  Date Value Ref Range Status  12/22/2013 87 mL/min Final    Comment:      The estimated GFR is a calculation valid for adults (>=36 years old) that uses the CKD-EPI algorithm to adjust for age and sex. It is   not to be used for children, pregnant women, hospitalized patients,    patients on dialysis, or with rapidly changing kidney function. According to the NKDEP, eGFR >89 is normal, 60-89 shows mild impairment, 30-59 shows moderate impairment, 15-29 shows severe impairment and <15 is ESRD.     GFR calc non Af Amer  Date Value Ref Range Status  08/21/2017 75 >59 mL/min/1.73 Final   GFR  Date Value Ref Range Status  08/09/2022 73.39 >60.00 mL/min Final    Comment:    Calculated using the CKD-EPI Creatinine Equation (2021)   eGFR  Date Value Ref Range Status  09/28/2021 81 >59 mL/min/1.73 Final         Passed - Patient is not pregnant  Passed - Last BP in normal range    BP Readings from Last 1 Encounters:  08/09/22 136/88

## 2022-09-17 ENCOUNTER — Ambulatory Visit: Payer: 59 | Attending: Nurse Practitioner | Admitting: Nurse Practitioner

## 2022-09-17 ENCOUNTER — Other Ambulatory Visit: Payer: Self-pay | Admitting: Nurse Practitioner

## 2022-09-17 ENCOUNTER — Encounter: Payer: Self-pay | Admitting: Nurse Practitioner

## 2022-09-17 VITALS — BP 127/87 | HR 82 | Temp 98.1°F | Ht 67.5 in | Wt 258.4 lb

## 2022-09-17 DIAGNOSIS — Z1211 Encounter for screening for malignant neoplasm of colon: Secondary | ICD-10-CM | POA: Diagnosis not present

## 2022-09-17 DIAGNOSIS — I10 Essential (primary) hypertension: Secondary | ICD-10-CM | POA: Diagnosis not present

## 2022-09-17 DIAGNOSIS — Z7689 Persons encountering health services in other specified circumstances: Secondary | ICD-10-CM

## 2022-09-17 DIAGNOSIS — Z1231 Encounter for screening mammogram for malignant neoplasm of breast: Secondary | ICD-10-CM | POA: Diagnosis not present

## 2022-09-17 MED ORDER — LISINOPRIL-HYDROCHLOROTHIAZIDE 20-25 MG PO TABS: 1.0000 | ORAL_TABLET | Freq: Every day | ORAL | 1 refills | Status: DC

## 2022-09-17 NOTE — Progress Notes (Signed)
Assessment & Plan:  [name] was seen today for establish care.  Diagnoses and all orders for this visit:  Encounter to establish care  Primary hypertension -     lisinopril-hydrochlorothiazide (ZESTORETIC) 20-25 MG tablet; Take 1 tablet by mouth daily.  Breast cancer screening by mammogram -     MM 3D SCREEN BREAST BILATERAL; Future  Colon cancer screening -     Cologuard    Patient has been counseled on age-appropriate routine health concerns for screening and prevention. These are reviewed and up-to-date. Referrals have been placed accordingly. Immunizations are up-to-date or declined.    Subjective:   Chief Complaint  Patient presents with   Establish Care    No concerns.   HPI Alexandria Clark 56 y.o. female presents to office today to establish care.  She has no questions or concerns today.  Patient has been counseled on age-appropriate routine health concerns for screening and prevention. These are reviewed and up-to-date. Referrals have been placed accordingly. Immunizations are up-to-date or declined.     Mammogram: Overdue.  Referral placed today. Cologuard: Overdue.  Orders placed today.  Patient states she had a normal Cologuard about 3 years ago Pap: Overdue.  Patient states she will follow-up with gynecology for this.   HTN She does monitor her blood pressure at home and reports readings similar if not lower to today's.  She is taking Zestoretic 20-25 mg daily as prescribed. BP Readings from Last 3 Encounters:  09/17/22 127/87  08/09/22 136/88  02/05/22 (!) 132/96    Hypothyroidism and thyromegaly are managed by Dr. Kelton Pillar. She has questions regarding the COVID booster Therapist, music) Would like to know if it will affect her thyroid levels. States she did not have issues with her thyroid until after receiving the COVID vaccines.  She denies any symptoms of hypo or hyperthyroidism and is taking PTU as prescribed  Review of Systems  Constitutional:  Negative  for fever, malaise/fatigue and weight loss.  HENT: Negative.  Negative for nosebleeds.   Eyes: Negative.  Negative for blurred vision, double vision and photophobia.  Respiratory: Negative.  Negative for cough and shortness of breath.   Cardiovascular: Negative.  Negative for chest pain, palpitations and leg swelling.  Gastrointestinal: Negative.  Negative for heartburn, nausea and vomiting.  Musculoskeletal: Negative.  Negative for myalgias.  Neurological: Negative.  Negative for dizziness, focal weakness, seizures and headaches.  Psychiatric/Behavioral: Negative.  Negative for suicidal ideas.     Past Medical History:  Diagnosis Date   Anxiety    Hypertension    Migraine    ocp induced   Other and unspecified hyperlipidemia     Past Surgical History:  Procedure Laterality Date   CESAREAN SECTION  05/2008    Family History  Problem Relation Age of Onset   Hypertension Mother    Hyperlipidemia Mother    Cancer Mother 41       Breast   Hypothyroidism Mother    Hypertension Maternal Aunt    Hyperlipidemia Maternal Aunt    Cancer Maternal Aunt 55       Breast; Multiple Myeloma   Heart disease Maternal Grandmother        MI @ 39- survived   Stroke Maternal Grandmother 66   Hypertension Maternal Uncle    Diabetes Maternal Uncle    Drug abuse Maternal Uncle    Heart disease Maternal Uncle    Diabetes Cousin     Social History Reviewed with no changes to be made today.  Outpatient Medications Prior to Visit  Medication Sig Dispense Refill   acetaminophen (TYLENOL) 500 MG tablet Take 2 tablets (1,000 mg total) by mouth every 8 (eight) hours as needed for mild pain or moderate pain. Take 2 tabs every 8 hours. 30 tablet 3   propylthiouracil (PTU) 50 MG tablet Take 1 tablet (50 mg total) by mouth 2 (two) times daily. 180 tablet 3   lisinopril-hydrochlorothiazide (ZESTORETIC) 20-25 MG tablet TAKE 1 TABLET BY MOUTH DAILY 19 tablet 0   cholecalciferol (VITAMIN D) 1000 UNITS  tablet Take 1,000 Units by mouth daily.     No facility-administered medications prior to visit.    Allergies  Allergen Reactions   Crestor [Rosuvastatin]    Enalapril Other (See Comments)    Dizziness   Fish Oil    Metoprolol Other (See Comments)    Dizziness and hair loss       Objective:    BP 127/87   Pulse 82   Temp 98.1 F (36.7 C) (Temporal)   Ht 5' 7.5" (1.715 m)   Wt 258 lb 6.4 oz (117.2 kg)   SpO2 100%   BMI 39.87 kg/m  Wt Readings from Last 3 Encounters:  09/17/22 258 lb 6.4 oz (117.2 kg)  08/09/22 256 lb (116.1 kg)  02/05/22 250 lb 9.6 oz (113.7 kg)    Physical Exam Vitals and nursing note reviewed.  Constitutional:      Appearance: She is well-developed.  HENT:     Head: Normocephalic and atraumatic.  Cardiovascular:     Rate and Rhythm: Normal rate and regular rhythm.     Heart sounds: Normal heart sounds. No murmur heard.    No friction rub. No gallop.  Pulmonary:     Effort: Pulmonary effort is normal. No tachypnea or respiratory distress.     Breath sounds: Normal breath sounds. No decreased breath sounds, wheezing, rhonchi or rales.  Chest:     Chest wall: No tenderness.  Abdominal:     General: Bowel sounds are normal.     Palpations: Abdomen is soft.  Musculoskeletal:        General: Normal range of motion.     Cervical back: Normal range of motion.  Skin:    General: Skin is warm and dry.  Neurological:     Mental Status: She is alert and oriented to person, place, and time.     Coordination: Coordination normal.  Psychiatric:        Behavior: Behavior normal. Behavior is cooperative.        Thought Content: Thought content normal.        Judgment: Judgment normal.          Patient has been counseled extensively about nutrition and exercise as well as the importance of adherence with medications and regular follow-up. The patient was given clear instructions to go to ER or return to medical center if symptoms don't improve,  worsen or new problems develop. The patient verbalized understanding.   Follow-up: Return for physical. patient can schedule based on her preference for date.   Gildardo Pounds, FNP-BC [facility], [place]   09/17/2022, 9:13 AM

## 2022-09-24 ENCOUNTER — Ambulatory Visit: Payer: 59 | Admitting: Nurse Practitioner

## 2022-12-04 ENCOUNTER — Ambulatory Visit: Payer: 59 | Admitting: Nurse Practitioner

## 2023-02-08 ENCOUNTER — Ambulatory Visit: Payer: 59 | Admitting: Internal Medicine

## 2023-03-13 ENCOUNTER — Other Ambulatory Visit: Payer: Self-pay | Admitting: Nurse Practitioner

## 2023-03-13 DIAGNOSIS — I10 Essential (primary) hypertension: Secondary | ICD-10-CM

## 2023-03-13 NOTE — Telephone Encounter (Signed)
Medication Refill - Medication: lisinopril-hydrochlorothiazide (ZESTORETIC) 20-25 MG tablet   Has the patient contacted their pharmacy? Yes.   Pt told to contact provider  Preferred Pharmacy (with phone number or street name):  [id number] [place], [place] - [address] AT [facility] Phone: [phone]  Fax: [phone]     Has the patient been seen for an appointment in the last year OR does the patient have an upcoming appointment? Yes.    Agent: Please be advised that RX refills may take up to 3 business days. We ask that you follow-up with your pharmacy.

## 2023-03-14 MED ORDER — LISINOPRIL-HYDROCHLOROTHIAZIDE 20-25 MG PO TABS: 1.0000 | ORAL_TABLET | Freq: Every day | ORAL | 0 refills | Status: DC

## 2023-03-14 NOTE — Telephone Encounter (Signed)
Patient will need an office visit for further refills. Requested Prescriptions  Pending Prescriptions Disp Refills   lisinopril-hydrochlorothiazide (ZESTORETIC) 20-25 MG tablet 90 tablet 0    Sig: Take 1 tablet by mouth daily.     Cardiovascular:  ACEI + Diuretic Combos Failed - 03/13/2023  3:20 PM      Failed - Na in normal range and within 180 days    Sodium  Date Value Ref Range Status  08/09/2022 138 135 - 145 mEq/L Final  09/28/2021 143 134 - 144 mmol/L Final         Failed - K in normal range and within 180 days    Potassium  Date Value Ref Range Status  08/09/2022 3.8 3.5 - 5.1 mEq/L Final         Failed - Cr in normal range and within 180 days    Creat  Date Value Ref Range Status  08/16/2016 0.82 0.50 - 1.05 mg/dL Final    Comment:      For patients > or = 57 years of age: The upper reference limit for Creatinine is approximately 13% higher for people identified as African-American.      Creatinine, Ser  Date Value Ref Range Status  08/09/2022 0.88 0.40 - 1.20 mg/dL Final         Failed - eGFR is 30 or above and within 180 days    GFR, Est African American  Date Value Ref Range Status  12/22/2013 >89 mL/min Final   GFR calc Af Amer  Date Value Ref Range Status  08/21/2017 87 >59 mL/min/1.73 Final   GFR, Est Non African American  Date Value Ref Range Status  12/22/2013 87 mL/min Final    Comment:      The estimated GFR is a calculation valid for adults (>=52 years old) that uses the CKD-EPI algorithm to adjust for age and sex. It is   not to be used for children, pregnant women, hospitalized patients,    patients on dialysis, or with rapidly changing kidney function. According to the NKDEP, eGFR >89 is normal, 60-89 shows mild impairment, 30-59 shows moderate impairment, 15-29 shows severe impairment and <15 is ESRD.     GFR calc non Af Amer  Date Value Ref Range Status  08/21/2017 75 >59 mL/min/1.73 Final   GFR  Date Value Ref Range Status   08/09/2022 73.39 >60.00 mL/min Final    Comment:    Calculated using the CKD-EPI Creatinine Equation (2021)   eGFR  Date Value Ref Range Status  09/28/2021 81 >59 mL/min/1.73 Final         Passed - Patient is not pregnant      Passed - Last BP in normal range    BP Readings from Last 1 Encounters:  09/17/22 127/87         Passed - Valid encounter within last 6 months    Recent Outpatient Visits           5 months ago Encounter to establish care   [facility] [place], Shea Stakes, NP   1 year ago Hyperthyroidism   [facility] [place], [place], [place]   5 years ago Essential hypertension   [facility], Manus Rudd, MD   6 years ago Essential hypertension   [facility], Newt Lukes, MD   8 years ago Essential hypertension   [facility] at [facility], Gwenlyn Found, MD

## 2023-05-29 ENCOUNTER — Other Ambulatory Visit: Payer: Self-pay | Admitting: Nurse Practitioner

## 2023-05-29 DIAGNOSIS — I10 Essential (primary) hypertension: Secondary | ICD-10-CM

## 2023-07-08 ENCOUNTER — Telehealth: Payer: 59 | Admitting: Nurse Practitioner

## 2023-07-08 ENCOUNTER — Encounter: Payer: Self-pay | Admitting: Nurse Practitioner

## 2023-07-08 DIAGNOSIS — E785 Hyperlipidemia, unspecified: Secondary | ICD-10-CM | POA: Diagnosis not present

## 2023-07-08 DIAGNOSIS — D649 Anemia, unspecified: Secondary | ICD-10-CM | POA: Diagnosis not present

## 2023-07-08 DIAGNOSIS — I10 Essential (primary) hypertension: Secondary | ICD-10-CM

## 2023-07-08 MED ORDER — LISINOPRIL-HYDROCHLOROTHIAZIDE 20-25 MG PO TABS: 1.0000 | ORAL_TABLET | Freq: Every day | ORAL | 0 refills | Status: DC

## 2023-07-08 NOTE — Progress Notes (Signed)
Virtual Visit Note  I discussed the limitations, risks, security and privacy concerns of performing an evaluation and management service by video and the availability of in person appointments. I also discussed with the patient that there may be a patient responsible charge related to this service. The patient expressed understanding and agreed to proceed.    I connected with Alexandria Clark on 07/08/23  at   3:50 PM EDT  EDT by VIDEO and verified that I am speaking with the correct person using two identifiers.   Location of Patient: Private Residence   Location of Provider: [facility]    Persons participating in VIRTUAL visit: Bertram Denver FNP-BC MYNA VAUGHN    History of Present Illness: VIRTUAL visit for: HTN  BP has been well controlled at home. With the following readings: 114/80 112/83, 116/83, 113/84 , 108/81  She just got a treadmill and plans to start working out. She is taking lisinopril-hydrochlorothiazide 20-25 mg daily as prescribed. Overdue for labs.  BP Readings from Last 3 Encounters:  09/17/22 127/87  08/09/22 136/88  02/05/22 (!) 132/96     Past Medical History:  Diagnosis Date   Anxiety    Hypertension    Migraine    ocp induced   Other and unspecified hyperlipidemia     Past Surgical History:  Procedure Laterality Date   CESAREAN SECTION  05/2008    Family History  Problem Relation Age of Onset   Hypertension Mother    Hyperlipidemia Mother    Cancer Mother 70       Breast   Hypothyroidism Mother    Hypertension Maternal Aunt    Hyperlipidemia Maternal Aunt    Cancer Maternal Aunt 24       Breast; Multiple Myeloma   Heart disease Maternal Grandmother        MI @ 44- survived   Stroke Maternal Grandmother 66   Hypertension Maternal Uncle    Diabetes Maternal Uncle    Drug abuse Maternal Uncle    Heart disease Maternal Uncle    Diabetes Cousin     Social History   Socioeconomic History    Marital status: Single    Spouse name: Not on file   Number of children: 1   Years of education: Not on file   Highest education level: Not on file  Occupational History   Occupation: Pharmacist, community    Employer: [employer]  Tobacco Use   Smoking status: Never   Smokeless tobacco: Never  Substance and Sexual Activity   Alcohol use: No   Drug use: No   Sexual activity: Yes  Other Topics Concern   Not on file  Social History Narrative   Not on file   Social Determinants of Health   Financial Resource Strain: Not on file  Food Insecurity: Not on file  Transportation Needs: Not on file  Physical Activity: Not on file  Stress: Not on file  Social Connections: Not on file     Observations/Objective: Awake, alert and oriented x 3   Review of Systems  Constitutional:  Negative for fever, malaise/fatigue and weight loss.  HENT: Negative.  Negative for nosebleeds.   Eyes: Negative.  Negative for blurred vision, double vision and photophobia.  Respiratory: Negative.  Negative for cough and shortness of breath.   Cardiovascular: Negative.  Negative for chest pain, palpitations and leg swelling.  Gastrointestinal: Negative.  Negative for heartburn, nausea and vomiting.  Musculoskeletal: Negative.  Negative for myalgias.  Neurological: Negative.  Negative for dizziness, focal weakness, seizures and headaches.  Psychiatric/Behavioral: Negative.  Negative for suicidal ideas.     Assessment and Plan: Diagnoses and all orders for this visit:  Primary hypertension -     lisinopril-hydrochlorothiazide (ZESTORETIC) 20-25 MG tablet; Take 1 tablet by mouth daily. -     CMP14+EGFR; Future -     Lipid panel; Future  Dyslipidemia -     Lipid panel; Future  Anemia, unspecified type -     CBC with Differential; Future     Follow Up Instructions Return in about 16 weeks (around 10/28/2023).     I discussed the assessment and treatment plan with the patient. The patient was  provided an opportunity to ask questions and all were answered. The patient agreed with the plan and demonstrated an understanding of the instructions.   The patient was advised to call back or seek an in-person evaluation if the symptoms worsen or if the condition fails to improve as anticipated.  I provided 12 minutes of face-to-face time during this encounter including median intraservice time, reviewing previous notes, labs, imaging, medications and explaining diagnosis and management.  Claiborne Rigg, FNP-BC

## 2023-08-22 ENCOUNTER — Telehealth: Payer: Self-pay | Admitting: Internal Medicine

## 2023-08-22 NOTE — Telephone Encounter (Signed)
Patient states she is out of her medication and Pharmacy is stating they can't refill. Please call [facility] on [address] and refill her    Propylphiouracil 50 mg. And notify patient

## 2023-08-23 MED ORDER — PROPYLTHIOURACIL 50 MG PO TABS: 50.0000 mg | ORAL_TABLET | Freq: Two times a day (BID) | ORAL | 0 refills | Status: DC

## 2023-08-29 ENCOUNTER — Telehealth: Payer: Self-pay

## 2023-08-29 NOTE — Telephone Encounter (Signed)
[facility] is requesting clearance for patient that needs to have a abstraction and a bone graft.

## 2023-08-30 NOTE — Telephone Encounter (Signed)
Tine advise to reach out to PCP for clearance

## 2023-10-21 ENCOUNTER — Ambulatory Visit: Payer: Self-pay

## 2023-10-21 NOTE — Telephone Encounter (Signed)
Patient called, left VM to return the call to the office to speak to the NT.    Summary: Vertigo   Pt is experiencing Vertigo, please advise  Best contact: [phone]

## 2023-10-21 NOTE — Telephone Encounter (Signed)
     Chief Complaint: Vertigo, when I turn my head a certain way. Left ear pressure. Asking to be worked in. Symptoms: Above Frequency: Last Friday Pertinent Negatives: Patient denies any other symptoms. Disposition: [] ED /[] Urgent Care (no appt availability in office) / [] Appointment(In office/virtual)/ []  [place] [facility]/ [] Home Care/ [] Refused Recommended Disposition /[] [place] [facility]/ [x]  Follow-up with PCP Additional Notes: Please advise pt.  Reason for Disposition  [1] MODERATE dizziness (e.g., vertigo; feels very unsteady, interferes with normal activities) AND [2] has NOT been evaluated by doctor (or NP/PA) for this  Answer Assessment - Initial Assessment Questions 1. DESCRIPTION: "Describe your dizziness."     Vertigo 2. VERTIGO: "Do you feel like either you or the room is spinning or tilting?"      Yes 3. LIGHTHEADED: "Do you feel lightheaded?" (e.g., somewhat faint, woozy, weak upon standing)     Yes 4. SEVERITY: "How bad is it?"  "Can you walk?"   - MILD: Feels slightly dizzy and unsteady, but is walking normally.   - MODERATE: Feels unsteady when walking, but not falling; interferes with normal activities (e.g., school, work).   - SEVERE: Unable to walk without falling, or requires assistance to walk without falling.     Mild-moderate 5. ONSET:  "When did the dizziness begin?"     Friday 6. AGGRAVATING FACTORS: "Does anything make it worse?" (e.g., standing, change in head position)     Moving head 7. CAUSE: "What do you think is causing the dizziness?"     Unsure 8. RECURRENT SYMPTOM: "Have you had dizziness before?" If Yes, ask: "When was the last time?" "What happened that time?"     Yes 9. OTHER SYMPTOMS: "Do you have any other symptoms?" (e.g., headache, weakness, numbness, vomiting, earache)     Left ear pressure 10. PREGNANCY: "Is there any chance you are pregnant?" "When was your last menstrual period?"       No  Protocols used: Dizziness -  Vertigo-A-AH

## 2023-10-22 ENCOUNTER — Ambulatory Visit: Payer: 59 | Admitting: Family Medicine

## 2023-10-22 ENCOUNTER — Encounter: Payer: Self-pay | Admitting: Family Medicine

## 2023-10-22 VITALS — BP 122/84 | HR 103 | Temp 98.0°F | Resp 16 | Ht 68.0 in | Wt 261.4 lb

## 2023-10-22 DIAGNOSIS — I10 Essential (primary) hypertension: Secondary | ICD-10-CM

## 2023-10-22 DIAGNOSIS — Z23 Encounter for immunization: Secondary | ICD-10-CM

## 2023-10-22 DIAGNOSIS — R42 Dizziness and giddiness: Secondary | ICD-10-CM

## 2023-10-22 MED ORDER — FLUTICASONE PROPIONATE 50 MCG/ACT NA SUSP: 2.0000 | Freq: Every day | NASAL | 1 refills | Status: AC

## 2023-10-22 MED ORDER — MECLIZINE HCL 25 MG PO TABS: 25.0000 mg | ORAL_TABLET | Freq: Three times a day (TID) | ORAL | 1 refills | Status: AC | PRN

## 2023-10-22 NOTE — Telephone Encounter (Signed)
She was scheduled an apt. For PCE to address vertigo.Given address for apt today at [address].

## 2023-10-22 NOTE — Telephone Encounter (Signed)
Left message on voicemail to return call.

## 2023-10-22 NOTE — Progress Notes (Unsigned)
Established Patient Office Visit  Subjective    Patient ID: Alexandria Clark, female    DOB: 1965-12-05  Age: 57 y.o. MRN: [id number]  CC:  Chief Complaint  Patient presents with   Dizziness    vertigo    HPI Alexandria Clark presents with complaint of vertigo. Patient has had sx intermittently but this episode is about 4-5 days and she feels pressure in her ears and sinuses. Patient's sx is worse by head movement. Patient took a benadryl for sx.   Outpatient Encounter Medications as of 10/22/2023  Medication Sig   acetaminophen (TYLENOL) 500 MG tablet Take 2 tablets (1,000 mg total) by mouth every 8 (eight) hours as needed for mild pain or moderate pain. Take 2 tabs every 8 hours.   lisinopril-hydrochlorothiazide (ZESTORETIC) 20-25 MG tablet Take 1 tablet by mouth daily.   propylthiouracil (PTU) 50 MG tablet Take 1 tablet (50 mg total) by mouth 2 (two) times daily.   No facility-administered encounter medications on file as of 10/22/2023.    Past Medical History:  Diagnosis Date   Anxiety    Hypertension    Migraine    ocp induced   Other and unspecified hyperlipidemia     Past Surgical History:  Procedure Laterality Date   CESAREAN SECTION  05/2008    Family History  Problem Relation Age of Onset   Hypertension Mother    Hyperlipidemia Mother    Cancer Mother 3       Breast   Hypothyroidism Mother    Hypertension Maternal Aunt    Hyperlipidemia Maternal Aunt    Cancer Maternal Aunt 83       Breast; Multiple Myeloma   Heart disease Maternal Grandmother        MI @ 18- survived   Stroke Maternal Grandmother 66   Hypertension Maternal Uncle    Diabetes Maternal Uncle    Drug abuse Maternal Uncle    Heart disease Maternal Uncle    Diabetes Cousin     Social History   Socioeconomic History   Marital status: Single    Spouse name: Not on file   Number of children: 1   Years of education: Not on file   Highest education level: Not on file   Occupational History   Occupation: Pharmacist, community    Employer: [employer]  Tobacco Use   Smoking status: Never   Smokeless tobacco: Never  Substance and Sexual Activity   Alcohol use: No   Drug use: No   Sexual activity: Yes  Other Topics Concern   Not on file  Social History Narrative   Not on file   Social Determinants of Health   Financial Resource Strain: Not on file  Food Insecurity: Not on file  Transportation Needs: Not on file  Physical Activity: Not on file  Stress: Not on file  Social Connections: Not on file  Intimate Partner Violence: Not on file    Review of Systems  Neurological:  Positive for dizziness.  All other systems reviewed and are negative.       Objective    BP 122/84 (BP Location: Right Arm, Patient Position: Sitting, Cuff Size: Large)   Pulse (!) 103   Temp 98 F (36.7 C) (Oral)   Resp 16   Ht 5\' 8"  (1.727 m)   Wt 261 lb 6.4 oz (118.6 kg)   SpO2 95%   BMI 39.75 kg/m   Physical Exam Vitals and nursing note reviewed.  Constitutional:  General: She is not in acute distress. Cardiovascular:     Rate and Rhythm: Normal rate and regular rhythm.  Pulmonary:     Effort: Pulmonary effort is normal.     Breath sounds: Normal breath sounds.  Abdominal:     Palpations: Abdomen is soft.     Tenderness: There is no abdominal tenderness.  Neurological:     General: No focal deficit present.     Mental Status: She is alert and oriented to person, place, and time.     {Labs (Optional):23779}    Assessment & Plan:   Encounter for immunization -     Flu vaccine trivalent PF, 6mos and older(Flulaval,Afluria,Fluarix,Fluzone)     No follow-ups on file.   Tommie Raymond, MD

## 2023-10-24 ENCOUNTER — Encounter: Payer: Self-pay | Admitting: Family Medicine

## 2023-10-25 ENCOUNTER — Ambulatory Visit (INDEPENDENT_AMBULATORY_CARE_PROVIDER_SITE_OTHER): Payer: 59 | Admitting: Internal Medicine

## 2023-10-25 VITALS — BP 118/70 | HR 69 | Ht 68.0 in | Wt 261.4 lb

## 2023-10-25 DIAGNOSIS — E0590 Thyrotoxicosis, unspecified without thyrotoxic crisis or storm: Secondary | ICD-10-CM | POA: Diagnosis not present

## 2023-10-25 LAB — T4, FREE: Free T4: 1.01 ng/dL (ref 0.60–1.60)

## 2023-10-25 LAB — TSH: TSH: 1.75 u[IU]/mL (ref 0.35–5.50)

## 2023-10-25 LAB — T3, FREE: T3, Free: 3.7 pg/mL (ref 2.3–4.2)

## 2023-10-25 MED ORDER — PROPYLTHIOURACIL 50 MG PO TABS: 50.0000 mg | ORAL_TABLET | Freq: Two times a day (BID) | ORAL | 3 refills | Status: DC

## 2023-10-25 NOTE — Progress Notes (Signed)
Name: Alexandria Clark  MRN/ DOB: [id number], [birth year]    Age/ Sex: 57 y.o., female     PCP: Claiborne Rigg, NP   Reason for Endocrinology Evaluation: Hyperthyroidism     Initial Endocrinology Clinic Visit: 04/17/2021    PATIENT IDENTIFIER: Alexandria Clark is a 57 y.o., female with a past medical history of Hyperthyrodism. She has followed with [facility] since 04/17/2021 for consultative assistance with management of her hyperthyroidism.   HISTORICAL SUMMARY: The patient was first diagnosed with hyperthyroidism in 11/2020. Pt was started on methimazole in 12/2020 but developed a rash and was switched to PTU   Patient has followed up with Dr. Everardo All from 2022 until March 2023   Mother, maternal aunt and cousins with thyroid disease  SUBJECTIVE:    Today (10/25/2023):  Alexandria Clark is here for a follow up on hyperthyroidism . Pt has NOT been seen at our clinic in 14 months.     Weight has been trending up Denies palpitations  Denies tremors No diarrhea or constipation  Denies eye symptoms  She was recently evaluated for vertigo She has a pending dental procedure    PTU 50 mg BID    HISTORY:  Past Medical History:  Past Medical History:  Diagnosis Date   Anxiety    Hypertension    Migraine    ocp induced   Other and unspecified hyperlipidemia    Past Surgical History:  Past Surgical History:  Procedure Laterality Date   CESAREAN SECTION  05/2008   Social History:  reports that she has never smoked. She has never used smokeless tobacco. She reports that she does not drink alcohol and does not use drugs. Family History:  Family History  Problem Relation Age of Onset   Hypertension Mother    Hyperlipidemia Mother    Cancer Mother 5       Breast   Hypothyroidism Mother    Hypertension Maternal Aunt    Hyperlipidemia Maternal Aunt    Cancer Maternal Aunt 66       Breast; Multiple Myeloma   Heart disease Maternal Grandmother         MI @ 44- survived   Stroke Maternal Grandmother 66   Hypertension Maternal Uncle    Diabetes Maternal Uncle    Drug abuse Maternal Uncle    Heart disease Maternal Uncle    Diabetes Cousin      HOME MEDICATIONS: Allergies as of 10/25/2023       Reactions   Crestor [rosuvastatin]    Enalapril Other (See Comments)   Dizziness   Fish Oil    Metoprolol Other (See Comments)   Dizziness and hair loss        Medication List        Accurate as of October 25, 2023  3:06 PM. If you have any questions, ask your nurse or doctor.          acetaminophen 500 MG tablet Commonly known as: TYLENOL Take 2 tablets (1,000 mg total) by mouth every 8 (eight) hours as needed for mild pain or moderate pain. Take 2 tabs every 8 hours.   fluticasone 50 MCG/ACT nasal spray Commonly known as: FLONASE Place 2 sprays into both nostrils daily.   lisinopril-hydrochlorothiazide 20-25 MG tablet Commonly known as: ZESTORETIC Take 1 tablet by mouth daily.   meclizine 25 MG tablet Commonly known as: ANTIVERT Take 1 tablet (25 mg total) by mouth 3 (three) times daily as needed for dizziness.  propylthiouracil 50 MG tablet Commonly known as: PTU Take 1 tablet (50 mg total) by mouth 2 (two) times daily.          OBJECTIVE:   PHYSICAL EXAM: VS: BP 118/70   Pulse 69   Ht 5\' 8"  (1.727 m)   Wt 261 lb 6.4 oz (118.6 kg)   SpO2 98%   BMI 39.75 kg/m    EXAM: General: Pt appears well and is in NAD  Eyes: External eye exam normal without stare, lid lag or exophthalmos  Neck: Thyroid:   Lungs: Clear with good BS bilat   Heart: Auscultation: RRR.  Abdomen: soft, nontender  Extremities:  BL LE: Trace pretibial edema   Mental Status: Judgment, insight: Intact Orientation: Oriented to time, place, and person Mood and affect: No depression, anxiety, or agitation     DATA REVIEWED:   Latest Reference Range & Units 10/25/23 10:04  TSH 0.35 - 5.50 uIU/mL 1.75   Triiodothyronine,Free,Serum 2.3 - 4.2 pg/mL 3.7  T4,Free(Direct) 0.60 - 1.60 ng/dL 2.13     Latest Reference Range & Units 08/09/22 12:13  Sodium 135 - 145 mEq/L 138  Potassium 3.5 - 5.1 mEq/L 3.8  Chloride 96 - 112 mEq/L 99  CO2 19 - 32 mEq/L 29  Glucose 70 - 99 mg/dL 086 (H)  BUN 6 - 23 mg/dL 14  Creatinine 5.78 - 4.69 mg/dL 6.29  Calcium 8.4 - 52.8 mg/dL 9.8  Alkaline Phosphatase 39 - 117 U/L 71  Albumin 3.5 - 5.2 g/dL 4.2  AST 0 - 37 U/L 15  ALT 0 - 35 U/L 11  Total Protein 6.0 - 8.3 g/dL 8.0  Total Bilirubin 0.2 - 1.2 mg/dL 0.4  GFR >41.32 mL/min 73.39    Latest Reference Range & Units 08/09/22 12:13  WBC 4.0 - 10.5 K/uL 5.2  RBC 3.87 - 5.11 Mil/uL 5.87 (H)  Hemoglobin 12.0 - 15.0 g/dL 44.0  HCT 10.2 - 72.5 % 43.0  MCV 78.0 - 100.0 fl 73.4 (L)  MCHC 30.0 - 36.0 g/dL 36.6  RDW 44.0 - 34.7 % 14.8  Platelets 150.0 - 400.0 K/uL 215.0  Glucose 70 - 99 mg/dL 425 (H)  TSH 9.56 - 3.87 uIU/mL 2.59  Triiodothyronine (T3) 76 - 181 ng/dL 564  P3,IRJJ(OACZYS) 0.63 - 1.60 ng/dL 0.16    Thyroid Ultrasound 08/20/2022 Mildly heterogeneous thyroid without hypervascularity. Left superior thyroid subcentimeter hypoechoic nodule measures only 5 mm. No follow-up recommended. No regional adenopathy.   IMPRESSION: Heterogeneous thyroid with benign subcentimeter nodule noted.  ASSESSMENT / PLAN / RECOMMENDATIONS:   Hyperthyroidism:   -Patient is clinically euthyroid -We discussed differential diagnosis of hyperthyroidism to include Graves' disease versus toxic thyroid nodule -Thyroid ultrasound did not reveal any thyroid nodules 08/2022 -She developed a rash to methimazole, tolerating PTU without any side effects -She has declined alternative therapy to include total thyroidectomy and RAI ablation in the past   Medications   Continue PTU 50 mg twice daily    Follow-up in 6 months   Signed electronically by: Lyndle Herrlich, MD  [facility]  [facility] [address] [place] [place], [place] [address] Phone: [phone] FAX: [phone]      CC: Claiborne Rigg, NP [address] [place] [address] [place] [address] Phone: [phone]  Fax: [phone]   Return to Endocrinology clinic as below: Future Appointments  Date Time Provider Department Center  10/29/2023 11:10 AM Claiborne Rigg, NP [facility] None  04/23/2024  9:50 AM Illyanna Petillo,  Konrad Dolores, MD [facility] None

## 2023-10-29 ENCOUNTER — Ambulatory Visit: Payer: 59 | Attending: Nurse Practitioner | Admitting: Nurse Practitioner

## 2023-10-29 ENCOUNTER — Telehealth: Payer: Self-pay | Admitting: Nurse Practitioner

## 2023-10-29 VITALS — BP 137/83 | HR 70 | Ht 68.0 in | Wt 262.6 lb

## 2023-10-29 DIAGNOSIS — Z1231 Encounter for screening mammogram for malignant neoplasm of breast: Secondary | ICD-10-CM

## 2023-10-29 DIAGNOSIS — I10 Essential (primary) hypertension: Secondary | ICD-10-CM | POA: Diagnosis not present

## 2023-10-29 DIAGNOSIS — K029 Dental caries, unspecified: Secondary | ICD-10-CM

## 2023-10-29 DIAGNOSIS — D649 Anemia, unspecified: Secondary | ICD-10-CM

## 2023-10-29 DIAGNOSIS — Z1211 Encounter for screening for malignant neoplasm of colon: Secondary | ICD-10-CM | POA: Diagnosis not present

## 2023-10-29 DIAGNOSIS — H8110 Benign paroxysmal vertigo, unspecified ear: Secondary | ICD-10-CM | POA: Diagnosis not present

## 2023-10-29 DIAGNOSIS — E785 Hyperlipidemia, unspecified: Secondary | ICD-10-CM

## 2023-10-29 MED ORDER — CHLORHEXIDINE GLUCONATE 0.12 % MT SOLN: 15.0000 mL | Freq: Two times a day (BID) | OROMUCOSAL | 1 refills | Status: AC

## 2023-10-29 MED ORDER — LISINOPRIL-HYDROCHLOROTHIAZIDE 20-25 MG PO TABS: 1.0000 | ORAL_TABLET | Freq: Every day | ORAL | 1 refills | Status: DC

## 2023-10-29 NOTE — Telephone Encounter (Signed)
Please have them fax a clearance letter to [place].

## 2023-10-29 NOTE — Progress Notes (Signed)
Assessment & Plan:  Alexandria Clark was seen today for medical management of chronic issues.  Diagnoses and all orders for this visit:  Primary hypertension -     lisinopril-hydrochlorothiazide (ZESTORETIC) 20-25 MG tablet; Take 1 tablet by mouth daily. -     Lipid panel -     CMP14+EGFR Continue all antihypertensives as prescribed.  Reminded to bring in blood pressure log for follow  up appointment.  RECOMMENDATIONS: DASH/Mediterranean Diets are healthier choices for HTN.    Colon cancer screening -     Cologuard  Breast cancer screening by mammogram -     MS 3D SCR MAMMO BILAT BR (aka MM); Future  Benign paroxysmal positional vertigo, unspecified laterality -     PT vestibular rehab; Future  Dental abscess -     chlorhexidine (PERIDEX) 0.12 % solution; Use as directed 15 mLs in the mouth or throat 2 (two) times daily.  Dyslipidemia -     Lipid panel  Anemia, unspecified type -     CBC with Differential    Patient has been counseled on age-appropriate routine health concerns for screening and prevention. These are reviewed and up-to-date. Referrals have been placed accordingly. Immunizations are up-to-date or declined.    Subjective:   Chief Complaint  Patient presents with   Medical Management of Chronic Issues    Alexandria Clark 57 y.o. female presents to office today for follow up to HTN  Patient has been counseled on age-appropriate routine health concerns for screening and prevention. These are reviewed and up-to-date. Referrals have been placed accordingly. Immunizations are up-to-date or declined.     Mammogram: Overdue.  Referral placed today. Cologuard: Overdue.  Orders placed today.   Pap: Overdue.  Patient will follow-up with gynecology for this.   HTN Blood pressure near goal with Zestoretic 20-25 mg daily.  She endorses adherence with taking BP Readings from Last 3 Encounters:  10/29/23 137/83  10/25/23 118/70  10/22/23 122/84   Thyroid disease and  thyromegaly are managed by Dr. Lonzo Cloud.    She has been experiencing symptoms of vertigo all year. Seems to be worsening over the past month.  Meclizine provides some relief of symptoms. This past Friday 10-18-2023.  She had a severe episode of vertigo which caused nausea and vomiting.  EMS was called however she declined to go to the hospital.  Aggravating factors: Movement or sudden position changes. ASSOCIATED:  ringing and pressure in both ears.     She states she has a fractured tooth and an abscess in her to down in the root and the tube needs to be removed by her dentist however her dentist needs medical clearance.  I have not received any form from her dentist as of today.  There is no evidence of abscess on physical exam of the left lower molar today.   Review of Systems  Constitutional:  Negative for fever, malaise/fatigue and weight loss.  HENT: Negative.  Negative for nosebleeds.   Eyes: Negative.  Negative for blurred vision, double vision and photophobia.  Respiratory: Negative.  Negative for cough and shortness of breath.   Cardiovascular: Negative.  Negative for chest pain, palpitations and leg swelling.  Gastrointestinal: Negative.  Negative for heartburn, nausea and vomiting.  Musculoskeletal: Negative.  Negative for myalgias.  Neurological:  Positive for dizziness. Negative for focal weakness, seizures and headaches.  Psychiatric/Behavioral: Negative.  Negative for suicidal ideas.     Past Medical History:  Diagnosis Date   Anxiety    Hypertension  Migraine    ocp induced   Other and unspecified hyperlipidemia     Past Surgical History:  Procedure Laterality Date   CESAREAN SECTION  05/2008    Family History  Problem Relation Age of Onset   Hypertension Mother    Hyperlipidemia Mother    Cancer Mother 79       Breast   Hypothyroidism Mother    Hypertension Maternal Aunt    Hyperlipidemia Maternal Aunt    Cancer Maternal Aunt 104       Breast;  Multiple Myeloma   Heart disease Maternal Grandmother        MI @ 44- survived   Stroke Maternal Grandmother 66   Hypertension Maternal Uncle    Diabetes Maternal Uncle    Drug abuse Maternal Uncle    Heart disease Maternal Uncle    Diabetes Cousin     Social History Reviewed with no changes to be made today.   Outpatient Medications Prior to Visit  Medication Sig Dispense Refill   acetaminophen (TYLENOL) 500 MG tablet Take 2 tablets (1,000 mg total) by mouth every 8 (eight) hours as needed for mild pain or moderate pain. Take 2 tabs every 8 hours. 30 tablet 3   fluticasone (FLONASE) 50 MCG/ACT nasal spray Place 2 sprays into both nostrils daily. 16 g 1   meclizine (ANTIVERT) 25 MG tablet Take 1 tablet (25 mg total) by mouth 3 (three) times daily as needed for dizziness. 30 tablet 1   propylthiouracil (PTU) 50 MG tablet Take 1 tablet (50 mg total) by mouth 2 (two) times daily. 180 tablet 3   lisinopril-hydrochlorothiazide (ZESTORETIC) 20-25 MG tablet Take 1 tablet by mouth daily. 90 tablet 0   No facility-administered medications prior to visit.    Allergies  Allergen Reactions   Crestor [Rosuvastatin]    Enalapril Other (See Comments)    Dizziness   Fish Oil    Metoprolol Other (See Comments)    Dizziness and hair loss       Objective:    BP 137/83 (BP Location: Left Arm, Patient Position: Sitting, Cuff Size: Large)   Pulse 70   Ht 5\' 8"  (1.727 m)   Wt 262 lb 9.6 oz (119.1 kg)   SpO2 98%   BMI 39.93 kg/m  Wt Readings from Last 3 Encounters:  10/29/23 262 lb 9.6 oz (119.1 kg)  10/25/23 261 lb 6.4 oz (118.6 kg)  10/22/23 261 lb 6.4 oz (118.6 kg)    Physical Exam Vitals and nursing note reviewed.  Constitutional:      Appearance: She is well-developed.  HENT:     Head: Normocephalic and atraumatic.     Mouth/Throat:     Dentition: Abnormal dentition. Does not have dentures. Dental tenderness and dental caries present. No gingival swelling, dental abscesses or  gum lesions.  Cardiovascular:     Rate and Rhythm: Normal rate and regular rhythm.     Heart sounds: Normal heart sounds. No murmur heard.    No friction rub. No gallop.  Pulmonary:     Effort: Pulmonary effort is normal. No tachypnea or respiratory distress.     Breath sounds: Normal breath sounds. No decreased breath sounds, wheezing, rhonchi or rales.  Chest:     Chest wall: No tenderness.  Abdominal:     General: Bowel sounds are normal.     Palpations: Abdomen is soft.  Musculoskeletal:        General: Normal range of motion.     Cervical back:  Normal range of motion.  Skin:    General: Skin is warm and dry.  Neurological:     Mental Status: She is alert and oriented to person, place, and time.     Coordination: Coordination normal.  Psychiatric:        Behavior: Behavior normal. Behavior is cooperative.        Thought Content: Thought content normal.        Judgment: Judgment normal.          Patient has been counseled extensively about nutrition and exercise as well as the importance of adherence with medications and regular follow-up. The patient was given clear instructions to go to ER or return to medical center if symptoms don't improve, worsen or new problems develop. The patient verbalized understanding.   Follow-up: Return in about 3 months (around 01/29/2024).   Claiborne Rigg, FNP-BC [facility] [place], [place] [phone]   10/29/2023, 2:20 PM

## 2023-10-29 NOTE — Telephone Encounter (Signed)
Alexandria Clark w/ [facility]. She said she got a call today asking why a clearance letter is needed. Alexandria Clark said the pt had a reaction in the past w/ lidocaine.  But she was on several different meds then, one being prednisone.   They need letter stating they can do dental work on her and use lidocaine.  She has some dental work that needs to be done.  Tina cb [phone] Fax  [phone]

## 2023-10-30 LAB — CBC WITH DIFFERENTIAL/PLATELET
Basophils Absolute: 0.1 10*3/uL (ref 0.0–0.2)
Basos: 1 %
EOS (ABSOLUTE): 0.2 10*3/uL (ref 0.0–0.4)
Eos: 4 %
Hematocrit: 43.8 % (ref 34.0–46.6)
Hemoglobin: 13.6 g/dL (ref 11.1–15.9)
Immature Grans (Abs): 0 10*3/uL (ref 0.0–0.1)
Immature Granulocytes: 0 %
Lymphocytes Absolute: 2.5 10*3/uL (ref 0.7–3.1)
Lymphs: 44 %
MCH: 22.9 pg — ABNORMAL LOW (ref 26.6–33.0)
MCHC: 31.1 g/dL — ABNORMAL LOW (ref 31.5–35.7)
MCV: 74 fL — ABNORMAL LOW (ref 79–97)
Monocytes Absolute: 0.4 10*3/uL (ref 0.1–0.9)
Monocytes: 7 %
Neutrophils Absolute: 2.5 10*3/uL (ref 1.4–7.0)
Neutrophils: 44 %
Platelets: 266 10*3/uL (ref 150–450)
RBC: 5.93 x10E6/uL — ABNORMAL HIGH (ref 3.77–5.28)
RDW: 14.3 % (ref 11.7–15.4)
WBC: 5.7 10*3/uL (ref 3.4–10.8)

## 2023-10-30 LAB — CMP14+EGFR
ALT: 10 [IU]/L (ref 0–32)
AST: 14 [IU]/L (ref 0–40)
Albumin: 4.4 g/dL (ref 3.8–4.9)
Alkaline Phosphatase: 82 [IU]/L (ref 44–121)
BUN/Creatinine Ratio: 14 (ref 9–23)
BUN: 12 mg/dL (ref 6–24)
Bilirubin Total: 0.3 mg/dL (ref 0.0–1.2)
CO2: 27 mmol/L (ref 20–29)
Calcium: 10 mg/dL (ref 8.7–10.2)
Chloride: 99 mmol/L (ref 96–106)
Creatinine, Ser: 0.86 mg/dL (ref 0.57–1.00)
Globulin, Total: 3.2 g/dL (ref 1.5–4.5)
Glucose: 96 mg/dL (ref 70–99)
Potassium: 4 mmol/L (ref 3.5–5.2)
Sodium: 141 mmol/L (ref 134–144)
Total Protein: 7.6 g/dL (ref 6.0–8.5)
eGFR: 79 mL/min/{1.73_m2} (ref >59)

## 2023-10-30 LAB — LIPID PANEL
Chol/HDL Ratio: 4 {ratio} (ref 0.0–4.4)
Cholesterol, Total: 264 mg/dL — ABNORMAL HIGH (ref 100–199)
HDL: 66 mg/dL (ref >39)
LDL Chol Calc (NIH): 185 mg/dL — ABNORMAL HIGH (ref 0–99)
Triglycerides: 79 mg/dL (ref 0–149)
VLDL Cholesterol Cal: 13 mg/dL (ref 5–40)

## 2023-10-30 NOTE — Telephone Encounter (Signed)
[facility] is closed until the beginning of December. Will reach out the to obtain form.

## 2023-10-31 ENCOUNTER — Other Ambulatory Visit: Payer: Self-pay | Admitting: Nurse Practitioner

## 2023-10-31 DIAGNOSIS — E7800 Pure hypercholesterolemia, unspecified: Secondary | ICD-10-CM

## 2023-10-31 MED ORDER — EZETIMIBE 10 MG PO TABS: 10.0000 mg | ORAL_TABLET | Freq: Every day | ORAL | 3 refills | Status: AC

## 2023-11-06 ENCOUNTER — Telehealth: Payer: Self-pay

## 2023-11-06 NOTE — Telephone Encounter (Signed)
Copied from CRM [id number]. Topic: General - Other >> Nov 06, 2023 12:31 PM Dondra Prader E wrote: Reason for CRM: Pt called regarding paperwork that she dropped off last week regarding vertigo.

## 2023-11-07 NOTE — Telephone Encounter (Signed)
Unable to reach patient by phone to relay results.  Voicemail left with details on process of  forms that need to be completed.

## 2023-11-14 ENCOUNTER — Other Ambulatory Visit: Payer: Self-pay | Admitting: Nurse Practitioner

## 2023-11-14 DIAGNOSIS — R42 Dizziness and giddiness: Secondary | ICD-10-CM

## 2023-11-14 NOTE — Telephone Encounter (Signed)
Patient has been informed of referral that has been sent in order for the forms to be filled out, due to the severity of her vertigo.

## 2023-11-14 NOTE — Telephone Encounter (Signed)
Patient returned call to office to get an update on the FMLA paperwork she needs to be completed. Please follow up with patient.

## 2023-11-15 ENCOUNTER — Telehealth: Payer: Self-pay

## 2023-11-15 NOTE — Telephone Encounter (Signed)
Copied from CRM [id number]. Topic: General - Other >> Nov 15, 2023  1:45 PM Phill Myron wrote: Attn: Arbie Cookey, CMA...  Please call patient. There are some corrections that are needed on her FMLA form(s). She states she can give them to you verbally or she can come up on Monday. Please advise.

## 2023-11-18 ENCOUNTER — Other Ambulatory Visit: Payer: Self-pay | Admitting: Nurse Practitioner

## 2023-11-18 DIAGNOSIS — H9311 Tinnitus, right ear: Secondary | ICD-10-CM

## 2023-11-18 NOTE — Telephone Encounter (Signed)
Patient has picked up forms to fax as is with no changes.

## 2023-12-02 ENCOUNTER — Telehealth: Payer: Self-pay

## 2023-12-02 NOTE — Telephone Encounter (Signed)
Mychart message sent.

## 2023-12-02 NOTE — Telephone Encounter (Signed)
Copied from CRM [id number]. Topic: General - Inquiry >> Dec 02, 2023  2:26 PM Albin Felling L wrote: Reason for CRM: Pt requesting letter on letter head for pt to be able to have tooth pulled by dentist [facility]). Dentist is wanting a letter stating that pt doesn't have any issues with her health and that it is okay to have tooth pulled. Pt states dentist office has had contact with provider and that forms have been faxed over before. Pt states she is unable to have tooth pulled until this is completed. Pt states she has been dealing with this since October 2024.

## 2023-12-04 HISTORY — PX: OTHER SURGICAL HISTORY: SHX169

## 2023-12-05 ENCOUNTER — Ambulatory Visit: Payer: 59 | Admitting: Physical Therapy

## 2023-12-06 ENCOUNTER — Ambulatory Visit: Payer: 59

## 2023-12-17 ENCOUNTER — Ambulatory Visit: Payer: 59

## 2023-12-20 ENCOUNTER — Telehealth: Payer: Self-pay | Admitting: Nurse Practitioner

## 2023-12-20 NOTE — Telephone Encounter (Signed)
Copied from CRM [id number]. Topic: General - Other >> Dec 20, 2023  9:05 AM Macon Large wrote: Reason for CRM: Pt requests that the medical clearance form be sent to [facility] asap because she has an appt for tooth removal next Wednesday. Cb# [phone]

## 2023-12-20 NOTE — Telephone Encounter (Signed)
Patient identified by name and date of birth.   Patient aware of forms being faxed 12/12/23

## 2023-12-25 ENCOUNTER — Telehealth: Payer: Self-pay

## 2023-12-25 NOTE — Telephone Encounter (Signed)
Form faxed via email.

## 2023-12-25 NOTE — Telephone Encounter (Signed)
Copied from CRM [id number]. Topic: General - Other >> Dec 25, 2023  1:16 PM Antony Haste wrote: Reason for CRM: Ashlyn from [facility] states the previous faxed documentation was not received, she is needing the medical clearance form be re-faxed. Fax #[phone]

## 2024-01-18 ENCOUNTER — Ambulatory Visit
Admission: EM | Admit: 2024-01-18 | Discharge: 2024-01-18 | Disposition: A | Discharge status: Home / Self Care | Payer: 59

## 2024-01-18 ENCOUNTER — Ambulatory Visit (INDEPENDENT_AMBULATORY_CARE_PROVIDER_SITE_OTHER): Payer: 59

## 2024-01-18 DIAGNOSIS — J189 Pneumonia, unspecified organism: Secondary | ICD-10-CM | POA: Diagnosis not present

## 2024-01-18 LAB — POC COVID19/FLU A&B COMBO
Covid Antigen, POC: NEGATIVE
Influenza A Antigen, POC: NEGATIVE
Influenza B Antigen, POC: NEGATIVE

## 2024-01-18 MED ORDER — AMOXICILLIN-POT CLAVULANATE 875-125 MG PO TABS: 1.0000 | ORAL_TABLET | Freq: Two times a day (BID) | ORAL | 0 refills | Status: DC

## 2024-01-18 NOTE — ED Triage Notes (Signed)
"  About 2 wks ago traveled to [employer] in [place], they all seemed to have gotten the Flu (as well as me), I have not been seen for this but my symptoms (fever) is recurrent and not improving (my HR is elevated with Temperature). No dizziness. No chest pain. Some cough at times with a lot of congestion.

## 2024-01-18 NOTE — ED Notes (Signed)
 Verbal Order (Note): Provider unable to access a computer creating unreasonable inconvenience to the ordering provider with possible delay in patient care. Acknowledged by Arlys John and Provider. Repeated Verbal order by Arlys John and Provider.

## 2024-01-29 ENCOUNTER — Other Ambulatory Visit: Payer: Self-pay | Admitting: Otolaryngology

## 2024-01-29 DIAGNOSIS — H903 Sensorineural hearing loss, bilateral: Secondary | ICD-10-CM

## 2024-01-29 NOTE — ED Provider Notes (Signed)
 [facility]    CSN: [id number] Arrival date & time: 01/18/24  1352      History   Chief Complaint Chief Complaint  Patient presents with   Fever    HPI Alexandria Clark is a 58 y.o. female.   Patient here today for evaluation of possible flu. 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 He has had fever and some cough with congestion. He does not report any vomiting or diarrhea.   The history is provided by the patient.  Fever Associated symptoms: congestion and cough   Associated symptoms: no diarrhea, no ear pain, no nausea, no sore throat and no vomiting     Past Medical History:  Diagnosis Date   Anxiety    Hypertension    Migraine    ocp induced   Other and unspecified hyperlipidemia     Patient Active Problem List   Diagnosis Date Noted   Thyromegaly 08/09/2022   Hyperthyroidism 04/17/2021   Hashimoto thyroiditis 11/08/2020   Obesity 03/11/2015   Dyslipidemia 01/21/2015   Palpitations 01/29/2012   Anxiety 01/24/2012   Hypertension    Migraine     Past Surgical History:  Procedure Laterality Date   CESAREAN SECTION  05/03/2008   Dental Procedure  12/2023    OB History   No obstetric history on file.      Home Medications    Prior to Admission medications   Medication Sig Start Date End Date Taking? Authorizing Provider  acetaminophen (TYLENOL) 500 MG tablet Take 2 tablets (1,000 mg total) by mouth every 8 (eight) hours as needed for mild pain or moderate pain. Take 2 tabs every 8 hours. 01/21/15  Yes Ofilia Neas, PA-C  amoxicillin-clavulanate (AUGMENTIN) 875-125 MG tablet Take 1 tablet by mouth every 12 (twelve) hours. 01/18/24  Yes Tomi Bamberger, PA-C  fluticasone [facility]) 50 MCG/ACT nasal spray Place 2 sprays into both nostrils daily. 10/22/23  Yes Georganna Skeans, MD  lisinopril-hydrochlorothiazide (ZESTORETIC) 20-25 MG tablet Take 1 tablet by mouth daily. 10/29/23  Yes Claiborne Rigg, NP   propylthiouracil (PTU) 50 MG tablet Take 1 tablet (50 mg total) by mouth 2 (two) times daily. 10/25/23  Yes Shamleffer, Konrad Dolores, MD  chlorhexidine (PERIDEX) 0.12 % solution Use as directed 15 mLs in the mouth or throat 2 (two) times daily. 10/29/23   Claiborne Rigg, NP  ezetimibe (ZETIA) 10 MG tablet Take 1 tablet (10 mg total) by mouth daily. 10/31/23   Claiborne Rigg, NP  meclizine (ANTIVERT) 25 MG tablet Take 1 tablet (25 mg total) by mouth 3 (three) times daily as needed for dizziness. 10/22/23   Georganna Skeans, MD  UNABLE TO FIND     [provider]    Family History Family History  Problem Relation Age of Onset   Hypertension Mother    Hyperlipidemia Mother    Cancer Mother 42       Breast   Hypothyroidism Mother    Heart disease Maternal Grandmother        MI @ 53- survived   Stroke Maternal Grandmother 66   Hypertension Maternal Aunt    Hyperlipidemia Maternal Aunt    Cancer Maternal Aunt 59       Breast; Multiple Myeloma   Hypertension Maternal Uncle    Diabetes Maternal Uncle    Drug abuse Maternal Uncle    Heart disease Maternal Uncle    Diabetes Cousin     Social History  Social History   Tobacco Use   Smoking status: Never   Smokeless tobacco: Never  Vaping Use   Vaping status: Never Used  Substance Use Topics   Alcohol use: No   Drug use: No     Allergies   Rosuvastatin, Enalapril, Fish oil, and Metoprolol   Review of Systems Review of Systems  Constitutional:  Positive for fever.  HENT:  Positive for congestion. Negative for ear pain and sore throat.   Eyes:  Negative for discharge and redness.  Respiratory:  Positive for cough. Negative for shortness of breath and wheezing.   Gastrointestinal:  Negative for abdominal pain, diarrhea, nausea and vomiting.     Physical Exam Triage Vital Signs ED Triage Vitals  Encounter Vitals Group     BP 01/18/24 1421 103/69     Systolic BP Percentile --      Diastolic BP Percentile  --      Pulse Rate 01/18/24 1421 (!) 107     Resp 01/18/24 1421 (!) 22     Temp 01/18/24 1421 97.6 F (36.4 C)     Temp Source 01/18/24 1421 Oral     SpO2 01/18/24 1421 93 %     Weight 01/18/24 1418 249 lb (112.9 kg)     Height 01/18/24 1418 5\' 8"  (1.727 m)     Head Circumference --      Peak Flow --      Pain Score 01/18/24 1415 0     Pain Loc --      Pain Education --      Exclude from Growth Chart --    No data found.  Updated Vital Signs BP 103/69 (BP Location: Left Arm)   Pulse (!) 105   Temp 97.6 F (36.4 C) (Oral)   Resp (!) 24   Ht 5\' 8"  (1.727 m)   Wt 249 lb (112.9 kg)   LMP  (LMP Unknown)   SpO2 93%   BMI 37.86 kg/m   Visual Acuity Right Eye Distance:   Left Eye Distance:   Bilateral Distance:    Right Eye Near:   Left Eye Near:    Bilateral Near:     Physical Exam Vitals and nursing note reviewed.  Constitutional:      General: She is not in acute distress.    Appearance: Normal appearance. She is not ill-appearing.  HENT:     Head: Normocephalic and atraumatic.     Right Ear: Tympanic membrane normal.     Left Ear: Tympanic membrane normal.     Nose: Congestion present.     Mouth/Throat:     Mouth: Mucous membranes are moist.     Pharynx: No oropharyngeal exudate or posterior oropharyngeal erythema.  Eyes:     Conjunctiva/sclera: Conjunctivae normal.  Cardiovascular:     Rate and Rhythm: Normal rate and regular rhythm.     Heart sounds: Normal heart sounds. No murmur heard. Pulmonary:     Effort: Pulmonary effort is normal. No respiratory distress.     Breath sounds: Normal breath sounds. No wheezing, rhonchi or rales.  Skin:    General: Skin is warm and dry.  Neurological:     Mental Status: She is alert.  Psychiatric:        Mood and Affect: Mood normal.        Thought Content: Thought content normal.      UC Treatments / Results  Labs (all labs ordered are listed, but only abnormal results are displayed) Labs  Reviewed  POC  COVID19/FLU A&B COMBO - Normal    EKG   Radiology No results found.  Procedures Procedures (including critical care time)  Medications Ordered in UC Medications - No data to display  Initial Impression / Assessment and Plan / UC Course  I have reviewed the triage vital signs and the nursing notes.  Pertinent labs & imaging results that were available during my care of the patient were reviewed by me and considered in my medical decision making (see chart for details).    Covid and flu screening negative. CXR with left lower lobe pneumonia. Will treat with augmentin and advised symptomatic treatment, increased fluids and follow up with any further concerns.   Final Clinical Impressions(s) / UC Diagnoses   Final diagnoses:  Pneumonia of left lower lobe due to infectious organism   Discharge Instructions   None    ED Prescriptions     Medication Sig Dispense Auth. Provider   amoxicillin-clavulanate (AUGMENTIN) 875-125 MG tablet Take 1 tablet by mouth every 12 (twelve) hours. 14 tablet Tomi Bamberger, PA-C      PDMP not reviewed this encounter.   Tomi Bamberger, PA-C 01/29/24 1028

## 2024-02-04 ENCOUNTER — Encounter: Payer: Self-pay | Admitting: Nurse Practitioner

## 2024-02-04 ENCOUNTER — Ambulatory Visit: Payer: 59 | Attending: Nurse Practitioner | Admitting: Nurse Practitioner

## 2024-02-04 VITALS — BP 118/75 | HR 95 | Wt 253.4 lb

## 2024-02-04 DIAGNOSIS — I10 Essential (primary) hypertension: Secondary | ICD-10-CM

## 2024-02-04 DIAGNOSIS — J189 Pneumonia, unspecified organism: Secondary | ICD-10-CM

## 2024-02-04 NOTE — Progress Notes (Signed)
 Assessment & Plan:  Alexandria Clark was seen today for hypertension.  Diagnoses and all orders for this visit:  Primary hypertension Continue zestoretic as prescribed.  Reminded to bring in blood pressure log for follow  up appointment.  RECOMMENDATIONS: DASH/Mediterranean Diets are healthier choices for HTN.    Pneumonia of left lower lobe due to infectious organism -     DG Chest 2 View; Future    Patient has been counseled on age-appropriate routine health concerns for screening and prevention. These are reviewed and up-to-date. Referrals have been placed accordingly. Immunizations are up-to-date or declined.    Subjective:   Chief Complaint  Patient presents with   Hypertension    Alexandria Clark 58 y.o. female presents to office today for follow up to HTN  Patient has been counseled on age-appropriate routine health concerns for screening and prevention. These are reviewed and up-to-date. Referrals have been placed accordingly. Immunizations are up-to-date or declined.     Mammogram: Overdue.  Referral placed  Cologuard: Overdue.  She has received the kit.   Pap: Overdue.  Patient will follow-up with gynecology for this.   HTN Blood pressure near goal with Zestoretic 20-25 mg daily.  She endorses adherence with taking as prescribed.  BP Readings from Last 3 Encounters:  02/04/24 118/75  01/18/24 103/69  10/29/23 137/83     Recently diagnosed with sensorineural hearing loss of the left ear. States she was told this was related to an infected tooth that she recently had removed.   PNA Diagnosed with PNA 01-18-2024 and treated with augmentin which she has completed. Denies any shortness of breath or chest pain.   Review of Systems  Constitutional:  Negative for fever, malaise/fatigue and weight loss.  HENT:  Positive for hearing loss. Negative for nosebleeds.   Eyes: Negative.  Negative for blurred vision, double vision and photophobia.  Respiratory: Negative.  Negative for  cough and shortness of breath.   Cardiovascular: Negative.  Negative for chest pain, palpitations and leg swelling.  Gastrointestinal: Negative.  Negative for heartburn, nausea and vomiting.  Musculoskeletal: Negative.  Negative for myalgias.  Neurological: Negative.  Negative for dizziness, focal weakness, seizures and headaches.  Psychiatric/Behavioral: Negative.  Negative for suicidal ideas.     Past Medical History:  Diagnosis Date   Anxiety    Hypertension    Migraine    ocp induced   Other and unspecified hyperlipidemia     Past Surgical History:  Procedure Laterality Date   CESAREAN SECTION  05/03/2008   Dental Procedure  12/2023    Family History  Problem Relation Age of Onset   Hypertension Mother    Hyperlipidemia Mother    Cancer Mother 18       Breast   Hypothyroidism Mother    Heart disease Maternal Grandmother        MI @ 20- survived   Stroke Maternal Grandmother 66   Hypertension Maternal Aunt    Hyperlipidemia Maternal Aunt    Cancer Maternal Aunt 62       Breast; Multiple Myeloma   Hypertension Maternal Uncle    Diabetes Maternal Uncle    Drug abuse Maternal Uncle    Heart disease Maternal Uncle    Diabetes Cousin     Social History Reviewed with no changes to be made today.   Outpatient Medications Prior to Visit  Medication Sig Dispense Refill   chlorhexidine (PERIDEX) 0.12 % solution Use as directed 15 mLs in the mouth or throat 2 (two)  times daily. 1000 mL 1   ezetimibe (ZETIA) 10 MG tablet Take 1 tablet (10 mg total) by mouth daily. 90 each 3   fluticasone (FLONASE) 50 MCG/ACT nasal spray Place 2 sprays into both nostrils daily. 16 g 1   lisinopril-hydrochlorothiazide (ZESTORETIC) 20-25 MG tablet Take 1 tablet by mouth daily. 90 tablet 1   meclizine (ANTIVERT) 25 MG tablet Take 1 tablet (25 mg total) by mouth 3 (three) times daily as needed for dizziness. 30 tablet 1   propylthiouracil (PTU) 50 MG tablet Take 1 tablet (50 mg total) by mouth  2 (two) times daily. 180 tablet 3   UNABLE TO FIND      acetaminophen (TYLENOL) 500 MG tablet Take 2 tablets (1,000 mg total) by mouth every 8 (eight) hours as needed for mild pain or moderate pain. Take 2 tabs every 8 hours. 30 tablet 3   amoxicillin-clavulanate (AUGMENTIN) 875-125 MG tablet Take 1 tablet by mouth every 12 (twelve) hours. 14 tablet 0   No facility-administered medications prior to visit.    Allergies  Allergen Reactions   Rosuvastatin Anaphylaxis and Itching    Crestor   Enalapril Other (See Comments)    Dizziness   Fish Oil    Metoprolol Other (See Comments)    Dizziness and hair loss       Objective:    BP 118/75 (BP Location: Left Arm, Patient Position: Sitting, Cuff Size: Normal)   Pulse 95   Wt 253 lb 6.4 oz (114.9 kg)   LMP  (LMP Unknown)   SpO2 97%   BMI 38.53 kg/m  Wt Readings from Last 3 Encounters:  02/04/24 253 lb 6.4 oz (114.9 kg)  01/18/24 249 lb (112.9 kg)  10/29/23 262 lb 9.6 oz (119.1 kg)    Physical Exam Vitals and nursing note reviewed.  Constitutional:      Appearance: She is well-developed.  HENT:     Head: Normocephalic and atraumatic.  Cardiovascular:     Rate and Rhythm: Normal rate and regular rhythm.     Heart sounds: Normal heart sounds. No murmur heard.    No friction rub. No gallop.  Pulmonary:     Effort: Pulmonary effort is normal. No tachypnea or respiratory distress.     Breath sounds: Normal breath sounds. No decreased breath sounds, wheezing, rhonchi or rales.  Chest:     Chest wall: No tenderness.  Abdominal:     General: Bowel sounds are normal.     Palpations: Abdomen is soft.  Musculoskeletal:        General: Normal range of motion.     Cervical back: Normal range of motion.  Skin:    General: Skin is warm and dry.  Neurological:     Mental Status: She is alert and oriented to person, place, and time.     Coordination: Coordination normal.  Psychiatric:        Behavior: Behavior normal. Behavior is  cooperative.        Thought Content: Thought content normal.        Judgment: Judgment normal.          Patient has been counseled extensively about nutrition and exercise as well as the importance of adherence with medications and regular follow-up. The patient was given clear instructions to go to ER or return to medical center if symptoms don't improve, worsen or new problems develop. The patient verbalized understanding.   Follow-up: Return in about 3 months (around 05/06/2024).   Claiborne Rigg,  FNP-BC [facility] and [facility] [place], [place] [phone]   02/04/2024, 12:54 PM

## 2024-02-11 ENCOUNTER — Telehealth: Payer: Self-pay

## 2024-02-11 NOTE — Telephone Encounter (Signed)
 Copied from CRM [id number]. Topic: Clinical - Medical Advice >> Feb 11, 2024 11:07 AM Patsy Lager T wrote: Reason for CRM: patient called stated she was diagnosed with pneumonia mid February at the [facility] but they took her out of work and it has to be [employer] paperwork completed for the days she was out. Please f/u with patient  to let her know if provider will need to see her to complete the paperwork or is she can just drop paperwork off. The deadline is Monday March 17th.

## 2024-02-11 NOTE — Telephone Encounter (Signed)
 Voicemail left to return call to set an appointment.

## 2024-02-11 NOTE — Telephone Encounter (Signed)
 Yes please. Thanks.

## 2024-02-12 ENCOUNTER — Telehealth: Payer: Self-pay

## 2024-02-12 ENCOUNTER — Encounter: Payer: Self-pay | Admitting: Nurse Practitioner

## 2024-02-12 ENCOUNTER — Ambulatory Visit: Attending: Nurse Practitioner | Admitting: Nurse Practitioner

## 2024-02-12 VITALS — BP 110/76 | HR 89 | Resp 19 | Ht 68.0 in | Wt 253.4 lb

## 2024-02-12 DIAGNOSIS — J189 Pneumonia, unspecified organism: Secondary | ICD-10-CM | POA: Diagnosis not present

## 2024-02-12 NOTE — Progress Notes (Signed)
 Assessment & Plan:  Alexandria Clark was seen today for fmla.  Diagnoses and all orders for this visit:  Pneumonia of left lower lobe due to infectious organism FMLA paperwork completed   Patient has been counseled on age-appropriate routine health concerns for screening and prevention. These are reviewed and up-to-date. Referrals have been placed accordingly. Immunizations are up-to-date or declined.    Subjective:   Chief Complaint  Patient presents with   FMLA    Alexandria Clark 58 y.o. female presents to office today for follow up to LLL PNA and paperwork completion.    Alexandria Clark was diagnosed with LLL PNA on 01-18-2024. She was treated with augmentin for 7 days. During this time she was out of work while also being treated with conservative measures at home. Today she is requesting FMLA papers be completed for her job with the dates that she was out of work with PNA She has no cardiac or respiratory symptoms today.      Review of Systems  Constitutional:  Negative for fever, malaise/fatigue and weight loss.  HENT: Negative.  Negative for nosebleeds.   Eyes: Negative.  Negative for blurred vision, double vision and photophobia.  Respiratory: Negative.  Negative for cough and shortness of breath.   Cardiovascular: Negative.  Negative for chest pain, palpitations and leg swelling.  Gastrointestinal: Negative.  Negative for heartburn, nausea and vomiting.  Musculoskeletal: Negative.  Negative for myalgias.  Neurological: Negative.  Negative for dizziness, focal weakness, seizures and headaches.  Psychiatric/Behavioral: Negative.  Negative for suicidal ideas.     Past Medical History:  Diagnosis Date   Anxiety    Hypertension    Migraine    ocp induced   Other and unspecified hyperlipidemia     Past Surgical History:  Procedure Laterality Date   CESAREAN SECTION  05/03/2008   Dental Procedure  12/2023    Family History  Problem Relation Age of Onset   Hypertension  Mother    Hyperlipidemia Mother    Cancer Mother 55       Breast   Hypothyroidism Mother    Heart disease Maternal Grandmother        MI @ 20- survived   Stroke Maternal Grandmother 66   Hypertension Maternal Aunt    Hyperlipidemia Maternal Aunt    Cancer Maternal Aunt 8       Breast; Multiple Myeloma   Hypertension Maternal Uncle    Diabetes Maternal Uncle    Drug abuse Maternal Uncle    Heart disease Maternal Uncle    Diabetes Cousin     Social History Reviewed with no changes to be made today.   Outpatient Medications Prior to Visit  Medication Sig Dispense Refill   chlorhexidine (PERIDEX) 0.12 % solution Use as directed 15 mLs in the mouth or throat 2 (two) times daily. 1000 mL 1   ezetimibe (ZETIA) 10 MG tablet Take 1 tablet (10 mg total) by mouth daily. 90 each 3   fluticasone (FLONASE) 50 MCG/ACT nasal spray Place 2 sprays into both nostrils daily. 16 g 1   lisinopril-hydrochlorothiazide (ZESTORETIC) 20-25 MG tablet Take 1 tablet by mouth daily. 90 tablet 1   meclizine (ANTIVERT) 25 MG tablet Take 1 tablet (25 mg total) by mouth 3 (three) times daily as needed for dizziness. 30 tablet 1   propylthiouracil (PTU) 50 MG tablet Take 1 tablet (50 mg total) by mouth 2 (two) times daily. 180 tablet 3   UNABLE TO FIND  No facility-administered medications prior to visit.    Allergies  Allergen Reactions   Rosuvastatin Anaphylaxis and Itching    Crestor   Enalapril Other (See Comments)    Dizziness   Fish Oil    Metoprolol Other (See Comments)    Dizziness and hair loss       Objective:    BP 110/76 (BP Location: Left Arm, Patient Position: Sitting, Cuff Size: Normal)   Pulse 89   Resp 19   Ht 5\' 8"  (1.727 m)   Wt 253 lb 6.4 oz (114.9 kg)   LMP  (Approximate)   SpO2 99%   BMI 38.53 kg/m  Wt Readings from Last 3 Encounters:  02/12/24 253 lb 6.4 oz (114.9 kg)  02/04/24 253 lb 6.4 oz (114.9 kg)  01/18/24 249 lb (112.9 kg)    Physical Exam Vitals and  nursing note reviewed.  Constitutional:      Appearance: She is well-developed.  HENT:     Head: Normocephalic and atraumatic.  Cardiovascular:     Rate and Rhythm: Normal rate and regular rhythm.     Heart sounds: Normal heart sounds. No murmur heard.    No friction rub. No gallop.  Pulmonary:     Effort: Pulmonary effort is normal. No tachypnea or respiratory distress.     Breath sounds: Normal breath sounds. No decreased breath sounds, wheezing, rhonchi or rales.  Chest:     Chest wall: No tenderness.  Abdominal:     General: Bowel sounds are normal.     Palpations: Abdomen is soft.  Musculoskeletal:        General: Normal range of motion.     Cervical back: Normal range of motion.  Skin:    General: Skin is warm and dry.  Neurological:     Mental Status: She is alert and oriented to person, place, and time.     Coordination: Coordination normal.  Psychiatric:        Behavior: Behavior normal. Behavior is cooperative.        Thought Content: Thought content normal.        Judgment: Judgment normal.          Patient has been counseled extensively about nutrition and exercise as well as the importance of adherence with medications and regular follow-up. The patient was given clear instructions to go to ER or return to medical center if symptoms don't improve, worsen or new problems develop. The patient verbalized understanding.   Follow-up: Return if symptoms worsen or fail to improve.   Claiborne Rigg, FNP-BC [facility] and [facility] [place], [place] [phone]   02/12/2024, 4:08 PM

## 2024-02-12 NOTE — Telephone Encounter (Signed)
 Copied from CRM [id number]. Topic: General - Other >> Feb 11, 2024  5:30 PM Emylou G wrote: Reason for CRM: Patient called returning [place] call for [facility] paperwork.. she can be reached at [phone]

## 2024-03-03 NOTE — Telephone Encounter (Signed)
 Patient called stated she needs provider to redo the section on her FMLA paperwork to reflect that she was out 2 intermitten days as she was not out continuous days. Please f/u with patient.

## 2024-03-04 NOTE — Telephone Encounter (Signed)
 Voicemail left informing patient that paper work has been revised.

## 2024-03-16 ENCOUNTER — Other Ambulatory Visit: Payer: Self-pay | Admitting: Nurse Practitioner

## 2024-03-16 NOTE — Telephone Encounter (Signed)
 Patient is calling in to follow up on her fmla paperwork

## 2024-03-16 NOTE — Telephone Encounter (Signed)
 Forms have been received will inform provider.

## 2024-03-18 ENCOUNTER — Telehealth: Payer: Self-pay | Admitting: Nurse Practitioner

## 2024-03-18 NOTE — Telephone Encounter (Signed)
 Voicemail left informing of patient that forms are ready for pickup.

## 2024-03-18 NOTE — Telephone Encounter (Signed)
 Copied from CRM [id number]. Topic: General - Other >> Mar 18, 2024 10:20 AM Alexandria Clark wrote: Patient calling to follow up on FMLA paperwork to see if it is completed. Please call ASAP to advise.

## 2024-04-23 ENCOUNTER — Ambulatory Visit (INDEPENDENT_AMBULATORY_CARE_PROVIDER_SITE_OTHER): Payer: 59 | Admitting: Internal Medicine

## 2024-04-23 ENCOUNTER — Encounter: Payer: Self-pay | Admitting: Internal Medicine

## 2024-04-23 VITALS — BP 112/78 | HR 77 | Ht 68.0 in | Wt 257.8 lb

## 2024-04-23 DIAGNOSIS — E0590 Thyrotoxicosis, unspecified without thyrotoxic crisis or storm: Secondary | ICD-10-CM

## 2024-04-23 MED ORDER — PROPYLTHIOURACIL 50 MG PO TABS: 50.0000 mg | ORAL_TABLET | Freq: Two times a day (BID) | ORAL | 3 refills | Status: AC

## 2024-04-23 NOTE — Progress Notes (Signed)
 Name: DYNASTY HOLQUIN  MRN/ DOB: [id number], [birth year]    Age/ Sex: 58 y.o., female     PCP: Collins Dean, NP   Reason for Endocrinology Evaluation: Hyperthyroidism     Initial Endocrinology Clinic Visit: 04/17/2021    PATIENT IDENTIFIER: Ms. TARALYN FERRAIOLO is a 58 y.o., female with a past medical history of Hyperthyrodism. She has followed with [facility] since 04/17/2021 for consultative assistance with management of her hyperthyroidism.   HISTORICAL SUMMARY: The patient was first diagnosed with hyperthyroidism in 11/2020. Pt was started on methimazole in 12/2020 but developed a rash and was switched to PTU   Patient has followed up with Dr. Washington Hacker from 2022 until March 2023   Mother, maternal aunt and cousins with thyroid  disease  SUBJECTIVE:    Today (04/23/2024):  Ms. Wynns is here for a follow up on hyperthyroidism .    Weight continues to fluctuate No local neck swelling  Denies palpitations  Denies tremors No diarrhea or constipation  Denies eye symptoms   PTU 50 mg BID    HISTORY:  Past Medical History:  Past Medical History:  Diagnosis Date   Anxiety    Hypertension    Migraine    ocp induced   Other and unspecified hyperlipidemia    Past Surgical History:  Past Surgical History:  Procedure Laterality Date   CESAREAN SECTION  05/03/2008   Dental Procedure  12/2023   Social History:  reports that she has never smoked. She has never used smokeless tobacco. She reports that she does not drink alcohol and does not use drugs. Family History:  Family History  Problem Relation Age of Onset   Hypertension Mother    Hyperlipidemia Mother    Cancer Mother 3       Breast   Hypothyroidism Mother    Heart disease Maternal Grandmother        MI @ 28- survived   Stroke Maternal Grandmother 17   Hypertension Maternal Aunt    Hyperlipidemia Maternal Aunt    Cancer Maternal Aunt 2       Breast; Multiple Myeloma   Hypertension  Maternal Uncle    Diabetes Maternal Uncle    Drug abuse Maternal Uncle    Heart disease Maternal Uncle    Diabetes Cousin      HOME MEDICATIONS: Allergies as of 04/23/2024       Reactions   Rosuvastatin  Anaphylaxis, Itching   Crestor    Enalapril Other (See Comments)   Dizziness   Fish Oil    Metoprolol Other (See Comments)   Dizziness and hair loss        Medication List        Accurate as of Apr 23, 2024  9:32 AM. If you have any questions, ask your nurse or doctor.          chlorhexidine  0.12 % solution Commonly known as: Peridex  Use as directed 15 mLs in the mouth or throat 2 (two) times daily.   ezetimibe  10 MG tablet Commonly known as: Zetia  Take 1 tablet (10 mg total) by mouth daily.   fluticasone  50 MCG/ACT nasal spray Commonly known as: FLONASE  Place 2 sprays into both nostrils daily.   lisinopril -hydrochlorothiazide  20-25 MG tablet Commonly known as: ZESTORETIC  Take 1 tablet by mouth daily.   meclizine  25 MG tablet Commonly known as: ANTIVERT  Take 1 tablet (25 mg total) by mouth 3 (three) times daily as needed for dizziness.   propylthiouracil  50 MG tablet Commonly  known as: PTU Take 1 tablet (50 mg total) by mouth 2 (two) times daily.   UNABLE TO FIND          OBJECTIVE:   PHYSICAL EXAM: VS: BP 112/78 (BP Location: Left Arm, Patient Position: Sitting, Cuff Size: Normal)   Pulse 77   Ht 5\' 8"  (1.727 m)   Wt 257 lb 12.8 oz (116.9 kg)   SpO2 99%   BMI 39.20 kg/m    EXAM: General: Pt appears well and is in NAD  Eyes: External eye exam normal without stare, lid lag or exophthalmos  Neck: Thyroid :   Lungs: Clear with good BS bilat   Heart: Auscultation: RRR.  Extremities:  BL LE: Trace pretibial edema   Mental Status: Judgment, insight: Intact Orientation: Oriented to time, place, and person Mood and affect: No depression, anxiety, or agitation     DATA REVIEWED:    Latest Reference Range & Units 04/23/24 09:45  TSH 0.40  - 4.50 mIU/L 1.52  T4,Free(Direct) 0.8 - 1.8 ng/dL 1.4     Latest Reference Range & Units 10/25/23 10:04  TSH 0.35 - 5.50 uIU/mL 1.75  Triiodothyronine,Free,Serum 2.3 - 4.2 pg/mL 3.7  T4,Free(Direct) 0.60 - 1.60 ng/dL 1.61     Latest Reference Range & Units 08/09/22 12:13  Sodium 135 - 145 mEq/L 138  Potassium 3.5 - 5.1 mEq/L 3.8  Chloride 96 - 112 mEq/L 99  CO2 19 - 32 mEq/L 29  Glucose 70 - 99 mg/dL 096 (H)  BUN 6 - 23 mg/dL 14  Creatinine 0.45 - 4.09 mg/dL 8.11  Calcium  8.4 - 10.5 mg/dL 9.8  Alkaline Phosphatase 39 - 117 U/L 71  Albumin 3.5 - 5.2 g/dL 4.2  AST 0 - 37 U/L 15  ALT 0 - 35 U/L 11  Total Protein 6.0 - 8.3 g/dL 8.0  Total Bilirubin 0.2 - 1.2 mg/dL 0.4  GFR >91.47 mL/min 73.39    Latest Reference Range & Units 08/09/22 12:13  WBC 4.0 - 10.5 K/uL 5.2  RBC 3.87 - 5.11 Mil/uL 5.87 (H)  Hemoglobin 12.0 - 15.0 g/dL 82.9  HCT 56.2 - 13.0 % 43.0  MCV 78.0 - 100.0 fl 73.4 (L)  MCHC 30.0 - 36.0 g/dL 86.5  RDW 78.4 - 69.6 % 14.8  Platelets 150.0 - 400.0 K/uL 215.0  Glucose 70 - 99 mg/dL 295 (H)  TSH 2.84 - 1.32 uIU/mL 2.59  Triiodothyronine (T3) 76 - 181 ng/dL 440  N0,UVOZ(DGUYQI) 3.47 - 1.60 ng/dL 4.25    Thyroid  Ultrasound 08/20/2022 Mildly heterogeneous thyroid  without hypervascularity. Left superior thyroid  subcentimeter hypoechoic nodule measures only 5 mm. No follow-up recommended. No regional adenopathy.   IMPRESSION: Heterogeneous thyroid  with benign subcentimeter nodule noted.  ASSESSMENT / PLAN / RECOMMENDATIONS:   Hyperthyroidism:   -Patient is clinically euthyroid -We discussed differential diagnosis of hyperthyroidism to include Graves' disease versus toxic thyroid  nodule -Thyroid  ultrasound did not reveal any thyroid  nodules 08/2022 -She developed a rash to methimazole, tolerating PTU without any side effects -She has declined alternative therapy to include total thyroidectomy and RAI ablation in the past   Medications   Continue PTU 50  mg twice daily    Follow-up in 6 months   Signed electronically by: Natale Bail, MD  [facility]  [facility] [address] [place] [place], [place] [address] Phone: [phone] FAX: [phone]      CC: Collins Dean, NP [address] [place] [address] Phone: [phone]  Fax: [phone]  Return to Endocrinology clinic as below: Future Appointments  Date Time Provider Department Center  04/23/2024  9:50 AM Lester Crickenberger, Julian Obey, MD [id number] None  05/06/2024 10:30 AM Collins Dean, NP [facility] None

## 2024-04-24 ENCOUNTER — Ambulatory Visit: Payer: Self-pay | Admitting: Internal Medicine

## 2024-04-24 LAB — TSH: TSH: 1.52 m[IU]/L (ref 0.40–4.50)

## 2024-04-24 LAB — T4, FREE: Free T4: 1.4 ng/dL (ref 0.8–1.8)

## 2024-05-06 ENCOUNTER — Ambulatory Visit: Attending: Nurse Practitioner | Admitting: Nurse Practitioner

## 2024-05-06 VITALS — BP 111/76 | HR 72 | Resp 19 | Ht 68.0 in | Wt 257.6 lb

## 2024-05-06 DIAGNOSIS — D649 Anemia, unspecified: Secondary | ICD-10-CM

## 2024-05-06 DIAGNOSIS — E7800 Pure hypercholesterolemia, unspecified: Secondary | ICD-10-CM | POA: Diagnosis not present

## 2024-05-06 DIAGNOSIS — I10 Essential (primary) hypertension: Secondary | ICD-10-CM | POA: Diagnosis not present

## 2024-05-06 DIAGNOSIS — J069 Acute upper respiratory infection, unspecified: Secondary | ICD-10-CM

## 2024-05-06 MED ORDER — PREDNISONE 20 MG PO TABS: 40.0000 mg | ORAL_TABLET | Freq: Every day | ORAL | 0 refills | Status: AC

## 2024-05-06 MED ORDER — LISINOPRIL-HYDROCHLOROTHIAZIDE 20-25 MG PO TABS: 1.0000 | ORAL_TABLET | Freq: Every day | ORAL | 1 refills | Status: DC

## 2024-05-06 NOTE — Progress Notes (Signed)
 Assessment & Plan:   Alexandria Clark was seen today for hypertension.  Diagnoses and all orders for this visit:  Primary hypertension -     lisinopril -hydrochlorothiazide  (ZESTORETIC ) 20-25 MG tablet; Take 1 tablet by mouth daily. -     CMP14+EGFR Continue all antihypertensives as prescribed.  Reminded to bring in blood pressure log for follow  up appointment.  RECOMMENDATIONS: DASH/Mediterranean Diets are healthier choices for HTN.    URI with cough and congestion -     predniSONE (DELTASONE) 20 MG tablet; Take 2 tablets (40 mg total) by mouth daily with breakfast for 5 days.  Hypercholesterolemia -     Lipid panel INSTRUCTIONS: Work on a low fat, heart healthy diet and participate in regular aerobic exercise program by working out at least 150 minutes per week; 5 days a week-30 minutes per day. Avoid red meat/beef/steak,  fried foods. junk foods, sodas, sugary drinks, unhealthy snacking, alcohol and smoking.  Drink at least 80 oz of water per day and monitor your carbohydrate intake daily.    Anemia, unspecified type -     CBC with Differential    Patient has been counseled on age-appropriate routine health concerns for screening and prevention. These are reviewed and up-to-date. Referrals have been placed accordingly. Immunizations are up-to-date or declined.    Subjective:   Chief Complaint  Patient presents with   Hypertension    Alexandria Clark 58 y.o. female presents to office today for follow up to HTN   HTN Blood pressure is well controlled.  She is taking Zestoretic  20-25 mg daily as prescribed BP Readings from Last 3 Encounters:  05/06/24 111/76  04/23/24 112/78  02/12/24 110/76     Patient has been counseled on age-appropriate routine health concerns for screening and prevention. These are reviewed and up-to-date. Referrals have been placed accordingly. Immunizations are up-to-date or declined.     MAMMOGRAM: Overdue.  Referral placed and patient is aware. PAP  SMEAR: Overdue. COLON CANCER SCREENING: Overdue.  Patient is aware Cologuard needs to be turned in  Upper Respiratory Infection: Patient complains of symptoms of a URI. Symptoms include congestion and cough. Onset of symptoms was several days ago, unchanged since that time. She also wheezing.  Evaluation to date: none. Treatment to date: none. She does have a history of PNA     Review of Systems  Constitutional:  Negative for fever, malaise/fatigue and weight loss.  HENT: Negative.  Negative for nosebleeds.   Eyes: Negative.  Negative for blurred vision, double vision and photophobia.  Respiratory:  Positive for cough. Negative for shortness of breath.   Cardiovascular: Negative.  Negative for chest pain, palpitations and leg swelling.  Gastrointestinal: Negative.  Negative for heartburn, nausea and vomiting.  Musculoskeletal: Negative.  Negative for myalgias.  Neurological: Negative.  Negative for dizziness, focal weakness, seizures and headaches.  Psychiatric/Behavioral: Negative.  Negative for suicidal ideas.     Past Medical History:  Diagnosis Date   Anxiety    Hypertension    Migraine    ocp induced   Other and unspecified hyperlipidemia     Past Surgical History:  Procedure Laterality Date   CESAREAN SECTION  05/03/2008   Dental Procedure  12/2023    Family History  Problem Relation Age of Onset   Hypertension Mother    Hyperlipidemia Mother    Cancer Mother 85       Breast   Hypothyroidism Mother    Heart disease Maternal Grandmother  MI @ 66- survived   Stroke Maternal Grandmother 66   Hypertension Maternal Aunt    Hyperlipidemia Maternal Aunt    Cancer Maternal Aunt 30       Breast; Multiple Myeloma   Hypertension Maternal Uncle    Diabetes Maternal Uncle    Drug abuse Maternal Uncle    Heart disease Maternal Uncle    Diabetes Cousin     Social History Reviewed with no changes to be made today.   Outpatient Medications Prior to Visit   Medication Sig Dispense Refill   chlorhexidine  (PERIDEX ) 0.12 % solution Use as directed 15 mLs in the mouth or throat 2 (two) times daily. 1000 mL 1   ezetimibe  (ZETIA ) 10 MG tablet Take 1 tablet (10 mg total) by mouth daily. 90 each 3   fluticasone  (FLONASE ) 50 MCG/ACT nasal spray Place 2 sprays into both nostrils daily. 16 g 1   meclizine  (ANTIVERT ) 25 MG tablet Take 1 tablet (25 mg total) by mouth 3 (three) times daily as needed for dizziness. 30 tablet 1   propylthiouracil  (PTU) 50 MG tablet Take 1 tablet (50 mg total) by mouth 2 (two) times daily. 180 tablet 3   UNABLE TO FIND      lisinopril -hydrochlorothiazide  (ZESTORETIC ) 20-25 MG tablet Take 1 tablet by mouth daily. 90 tablet 1   No facility-administered medications prior to visit.    Allergies  Allergen Reactions   Rosuvastatin  Anaphylaxis and Itching    Crestor    Enalapril Other (See Comments)    Dizziness   Fish Oil    Metoprolol Other (See Comments)    Dizziness and hair loss       Objective:    BP 111/76 (BP Location: Left Arm, Patient Position: Sitting, Cuff Size: Normal)   Pulse 72   Resp 19   Ht 5\' 8"  (1.727 m)   Wt 257 lb 9.6 oz (116.8 kg)   SpO2 100%   BMI 39.17 kg/m  Wt Readings from Last 3 Encounters:  05/06/24 257 lb 9.6 oz (116.8 kg)  04/23/24 257 lb 12.8 oz (116.9 kg)  02/12/24 253 lb 6.4 oz (114.9 kg)    Physical Exam Vitals and nursing note reviewed.  Constitutional:      Appearance: She is well-developed.  HENT:     Head: Normocephalic and atraumatic.  Cardiovascular:     Rate and Rhythm: Normal rate and regular rhythm.     Heart sounds: Normal heart sounds. No murmur heard.    No friction rub. No gallop.  Pulmonary:     Effort: Pulmonary effort is normal. No tachypnea or respiratory distress.     Breath sounds: Wheezing present. No decreased breath sounds, rhonchi or rales.  Chest:     Chest wall: No tenderness.  Abdominal:     General: Bowel sounds are normal.     Palpations:  Abdomen is soft.  Musculoskeletal:        General: Normal range of motion.     Cervical back: Normal range of motion.  Skin:    General: Skin is warm and dry.  Neurological:     Mental Status: She is alert and oriented to person, place, and time.     Coordination: Coordination normal.  Psychiatric:        Behavior: Behavior normal. Behavior is cooperative.        Thought Content: Thought content normal.        Judgment: Judgment normal.          Patient has  been counseled extensively about nutrition and exercise as well as the importance of adherence with medications and regular follow-up. The patient was given clear instructions to go to ER or return to medical center if symptoms don't improve, worsen or new problems develop. The patient verbalized understanding.   Follow-up: Return in about 6 months (around 11/05/2024).   Collins Dean, FNP-BC [facility] and [facility] [place], [place] [phone]   05/06/2024, 11:32 AM

## 2024-05-07 ENCOUNTER — Ambulatory Visit: Payer: Self-pay | Admitting: Nurse Practitioner

## 2024-05-07 LAB — LIPID PANEL
Chol/HDL Ratio: 4 ratio (ref 0.0–4.4)
Cholesterol, Total: 261 mg/dL — ABNORMAL HIGH (ref 100–199)
HDL: 65 mg/dL (ref >39)
LDL Chol Calc (NIH): 182 mg/dL — ABNORMAL HIGH (ref 0–99)
Triglycerides: 85 mg/dL (ref 0–149)
VLDL Cholesterol Cal: 14 mg/dL (ref 5–40)

## 2024-05-07 LAB — CBC WITH DIFFERENTIAL/PLATELET
Basophils Absolute: 0.1 10*3/uL (ref 0.0–0.2)
Basos: 1 %
EOS (ABSOLUTE): 0.4 10*3/uL (ref 0.0–0.4)
Eos: 7 %
Hematocrit: 44.8 % (ref 34.0–46.6)
Hemoglobin: 13.7 g/dL (ref 11.1–15.9)
Immature Grans (Abs): 0 10*3/uL (ref 0.0–0.1)
Immature Granulocytes: 0 %
Lymphocytes Absolute: 2.4 10*3/uL (ref 0.7–3.1)
Lymphs: 46 %
MCH: 22.7 pg — ABNORMAL LOW (ref 26.6–33.0)
MCHC: 30.6 g/dL — ABNORMAL LOW (ref 31.5–35.7)
MCV: 74 fL — ABNORMAL LOW (ref 79–97)
Monocytes Absolute: 0.4 10*3/uL (ref 0.1–0.9)
Monocytes: 7 %
Neutrophils Absolute: 2.1 10*3/uL (ref 1.4–7.0)
Neutrophils: 39 %
Platelets: 252 10*3/uL (ref 150–450)
RBC: 6.03 x10E6/uL — ABNORMAL HIGH (ref 3.77–5.28)
RDW: 13.5 % (ref 11.7–15.4)
WBC: 5.3 10*3/uL (ref 3.4–10.8)

## 2024-05-07 LAB — CMP14+EGFR
ALT: 11 IU/L (ref 0–32)
AST: 15 IU/L (ref 0–40)
Albumin: 4.4 g/dL (ref 3.8–4.9)
Alkaline Phosphatase: 82 IU/L (ref 44–121)
BUN/Creatinine Ratio: 15 (ref 9–23)
BUN: 14 mg/dL (ref 6–24)
Bilirubin Total: 0.4 mg/dL (ref 0.0–1.2)
CO2: 26 mmol/L (ref 20–29)
Calcium: 10 mg/dL (ref 8.7–10.2)
Chloride: 99 mmol/L (ref 96–106)
Creatinine, Ser: 0.91 mg/dL (ref 0.57–1.00)
Globulin, Total: 3 g/dL (ref 1.5–4.5)
Glucose: 97 mg/dL (ref 70–99)
Potassium: 4.1 mmol/L (ref 3.5–5.2)
Sodium: 140 mmol/L (ref 134–144)
Total Protein: 7.4 g/dL (ref 6.0–8.5)
eGFR: 73 mL/min/{1.73_m2} (ref >59)

## 2024-10-28 ENCOUNTER — Other Ambulatory Visit: Payer: Self-pay | Admitting: Nurse Practitioner

## 2024-10-28 DIAGNOSIS — I10 Essential (primary) hypertension: Secondary | ICD-10-CM

## 2024-11-04 ENCOUNTER — Telehealth: Payer: Self-pay | Admitting: Nurse Practitioner

## 2024-11-04 NOTE — Telephone Encounter (Signed)
 Pt unconfirmed appt 12/3 (per vr ) lvm

## 2024-11-06 ENCOUNTER — Ambulatory Visit: Admitting: Nurse Practitioner
